# Patient Record
Sex: Female | Born: 1965 | Hispanic: No | Marital: Married | Smoking: Never smoker
Health system: Southern US, Community
[De-identification: ages and names within clinical notes are randomized; demographics above are authoritative.]

## PROBLEM LIST (undated history)

## (undated) DIAGNOSIS — J392 Other diseases of pharynx: Secondary | ICD-10-CM

## (undated) DIAGNOSIS — Z8759 Personal history of other complications of pregnancy, childbirth and the puerperium: Secondary | ICD-10-CM

## (undated) DIAGNOSIS — R51 Headache: Secondary | ICD-10-CM

## (undated) DIAGNOSIS — E041 Nontoxic single thyroid nodule: Secondary | ICD-10-CM

## (undated) DIAGNOSIS — E042 Nontoxic multinodular goiter: Secondary | ICD-10-CM

## (undated) DIAGNOSIS — R059 Cough, unspecified: Secondary | ICD-10-CM

## (undated) DIAGNOSIS — R519 Headache, unspecified: Secondary | ICD-10-CM

## (undated) DIAGNOSIS — R509 Fever, unspecified: Secondary | ICD-10-CM

## (undated) DIAGNOSIS — R05 Cough: Secondary | ICD-10-CM

## (undated) DIAGNOSIS — R140 Abdominal distension (gaseous): Secondary | ICD-10-CM

## (undated) HISTORY — PX: DILATION AND CURETTAGE OF UTERUS: SHX78

## (undated) HISTORY — DX: Nontoxic multinodular goiter: E04.2

## (undated) HISTORY — DX: Fever, unspecified: R50.9

---

## 2000-12-09 ENCOUNTER — Other Ambulatory Visit: Admission: RE | Admit: 2000-12-09 | Discharge: 2000-12-09 | Payer: Self-pay | Admitting: Obstetrics & Gynecology

## 2002-08-12 ENCOUNTER — Encounter: Admission: RE | Admit: 2002-08-12 | Discharge: 2002-08-12 | Payer: Self-pay | Admitting: Internal Medicine

## 2002-08-12 ENCOUNTER — Encounter: Payer: Self-pay | Admitting: Internal Medicine

## 2002-08-13 ENCOUNTER — Encounter: Admission: RE | Admit: 2002-08-13 | Discharge: 2002-08-13 | Payer: Self-pay | Admitting: Obstetrics and Gynecology

## 2002-08-13 ENCOUNTER — Encounter: Payer: Self-pay | Admitting: Obstetrics and Gynecology

## 2005-06-21 ENCOUNTER — Encounter: Admission: RE | Admit: 2005-06-21 | Discharge: 2005-06-21 | Payer: Self-pay | Admitting: Internal Medicine

## 2006-07-01 ENCOUNTER — Encounter: Admission: RE | Admit: 2006-07-01 | Discharge: 2006-07-01 | Payer: Self-pay | Admitting: Internal Medicine

## 2008-01-26 ENCOUNTER — Encounter: Admission: RE | Admit: 2008-01-26 | Discharge: 2008-01-26 | Payer: Self-pay | Admitting: Endocrinology

## 2008-02-23 ENCOUNTER — Encounter (INDEPENDENT_AMBULATORY_CARE_PROVIDER_SITE_OTHER): Payer: Self-pay | Admitting: Diagnostic Radiology

## 2008-02-23 ENCOUNTER — Other Ambulatory Visit: Admission: RE | Admit: 2008-02-23 | Discharge: 2008-02-23 | Payer: Self-pay | Admitting: Diagnostic Radiology

## 2008-02-23 ENCOUNTER — Encounter: Admission: RE | Admit: 2008-02-23 | Discharge: 2008-02-23 | Payer: Self-pay | Admitting: Endocrinology

## 2009-04-06 ENCOUNTER — Encounter: Admission: RE | Admit: 2009-04-06 | Discharge: 2009-04-06 | Payer: Self-pay | Admitting: Internal Medicine

## 2009-05-11 ENCOUNTER — Encounter: Admission: RE | Admit: 2009-05-11 | Discharge: 2009-05-11 | Payer: Self-pay | Admitting: Internal Medicine

## 2010-06-27 ENCOUNTER — Encounter: Admission: RE | Admit: 2010-06-27 | Discharge: 2010-06-27 | Payer: Self-pay | Admitting: Internal Medicine

## 2011-01-20 ENCOUNTER — Encounter: Payer: Self-pay | Admitting: Internal Medicine

## 2011-05-21 ENCOUNTER — Other Ambulatory Visit: Payer: Self-pay | Admitting: Internal Medicine

## 2011-05-21 DIAGNOSIS — Z1231 Encounter for screening mammogram for malignant neoplasm of breast: Secondary | ICD-10-CM

## 2011-06-19 ENCOUNTER — Ambulatory Visit
Admission: RE | Admit: 2011-06-19 | Discharge: 2011-06-19 | Disposition: A | Discharge status: Home / Self Care | Payer: BC Managed Care – PPO | Source: Ambulatory Visit | Attending: Internal Medicine | Admitting: Internal Medicine

## 2011-06-19 DIAGNOSIS — Z1231 Encounter for screening mammogram for malignant neoplasm of breast: Secondary | ICD-10-CM

## 2012-05-14 ENCOUNTER — Other Ambulatory Visit: Payer: Self-pay | Admitting: Internal Medicine

## 2012-05-14 DIAGNOSIS — Z1231 Encounter for screening mammogram for malignant neoplasm of breast: Secondary | ICD-10-CM

## 2012-06-23 ENCOUNTER — Ambulatory Visit: Payer: BC Managed Care – PPO

## 2012-06-24 ENCOUNTER — Ambulatory Visit
Admission: RE | Admit: 2012-06-24 | Discharge: 2012-06-24 | Disposition: A | Discharge status: Home / Self Care | Payer: BC Managed Care – PPO | Source: Ambulatory Visit | Attending: Internal Medicine | Admitting: Internal Medicine

## 2012-06-24 DIAGNOSIS — Z1231 Encounter for screening mammogram for malignant neoplasm of breast: Secondary | ICD-10-CM

## 2013-08-11 ENCOUNTER — Other Ambulatory Visit: Payer: Self-pay

## 2013-08-11 DIAGNOSIS — Z1231 Encounter for screening mammogram for malignant neoplasm of breast: Secondary | ICD-10-CM

## 2013-08-23 ENCOUNTER — Ambulatory Visit: Payer: BC Managed Care – PPO

## 2013-09-09 ENCOUNTER — Ambulatory Visit
Admission: RE | Admit: 2013-09-09 | Discharge: 2013-09-09 | Disposition: A | Discharge status: Home / Self Care | Payer: 59 | Source: Ambulatory Visit

## 2013-09-09 DIAGNOSIS — Z1231 Encounter for screening mammogram for malignant neoplasm of breast: Secondary | ICD-10-CM

## 2014-03-08 ENCOUNTER — Other Ambulatory Visit: Payer: Self-pay

## 2014-03-08 DIAGNOSIS — Z1231 Encounter for screening mammogram for malignant neoplasm of breast: Secondary | ICD-10-CM

## 2014-09-01 ENCOUNTER — Ambulatory Visit
Admission: RE | Admit: 2014-09-01 | Discharge: 2014-09-01 | Disposition: A | Discharge status: Home / Self Care | Payer: BC Managed Care – PPO | Source: Ambulatory Visit

## 2014-09-01 DIAGNOSIS — Z1231 Encounter for screening mammogram for malignant neoplasm of breast: Secondary | ICD-10-CM

## 2015-01-29 DIAGNOSIS — R6510 Systemic inflammatory response syndrome (SIRS) of non-infectious origin without acute organ dysfunction: Secondary | ICD-10-CM | POA: Diagnosis present

## 2015-01-29 DIAGNOSIS — R911 Solitary pulmonary nodule: Secondary | ICD-10-CM | POA: Diagnosis present

## 2015-01-29 DIAGNOSIS — D649 Anemia, unspecified: Secondary | ICD-10-CM | POA: Diagnosis present

## 2015-01-29 DIAGNOSIS — J210 Acute bronchiolitis due to respiratory syncytial virus: Principal | ICD-10-CM | POA: Diagnosis present

## 2015-01-29 DIAGNOSIS — E042 Nontoxic multinodular goiter: Secondary | ICD-10-CM | POA: Diagnosis present

## 2015-01-29 DIAGNOSIS — T753XXA Motion sickness, initial encounter: Secondary | ICD-10-CM | POA: Diagnosis present

## 2015-01-29 DIAGNOSIS — D259 Leiomyoma of uterus, unspecified: Secondary | ICD-10-CM | POA: Diagnosis present

## 2015-01-29 DIAGNOSIS — E876 Hypokalemia: Secondary | ICD-10-CM | POA: Diagnosis present

## 2015-01-29 DIAGNOSIS — R509 Fever, unspecified: Secondary | ICD-10-CM | POA: Diagnosis not present

## 2015-01-29 DIAGNOSIS — R740 Nonspecific elevation of levels of transaminase and lactic acid dehydrogenase [LDH]: Secondary | ICD-10-CM | POA: Diagnosis present

## 2015-01-30 ENCOUNTER — Inpatient Hospital Stay (HOSPITAL_COMMUNITY)
Admission: EM | Admit: 2015-01-30 | Discharge: 2015-02-02 | DRG: 202 | Disposition: A | Discharge status: Home / Self Care | Payer: BLUE CROSS/BLUE SHIELD | Attending: Internal Medicine | Admitting: Internal Medicine

## 2015-01-30 ENCOUNTER — Emergency Department (HOSPITAL_COMMUNITY): Payer: BLUE CROSS/BLUE SHIELD

## 2015-01-30 ENCOUNTER — Inpatient Hospital Stay (HOSPITAL_COMMUNITY): Payer: BLUE CROSS/BLUE SHIELD

## 2015-01-30 ENCOUNTER — Encounter (HOSPITAL_COMMUNITY): Payer: Self-pay | Admitting: *Deleted

## 2015-01-30 DIAGNOSIS — R05 Cough: Secondary | ICD-10-CM | POA: Insufficient documentation

## 2015-01-30 DIAGNOSIS — R740 Nonspecific elevation of levels of transaminase and lactic acid dehydrogenase [LDH]: Secondary | ICD-10-CM

## 2015-01-30 DIAGNOSIS — T753XXA Motion sickness, initial encounter: Secondary | ICD-10-CM | POA: Diagnosis present

## 2015-01-30 DIAGNOSIS — E042 Nontoxic multinodular goiter: Secondary | ICD-10-CM | POA: Diagnosis present

## 2015-01-30 DIAGNOSIS — R5081 Fever presenting with conditions classified elsewhere: Secondary | ICD-10-CM | POA: Diagnosis not present

## 2015-01-30 DIAGNOSIS — R7401 Elevation of levels of liver transaminase levels: Secondary | ICD-10-CM | POA: Insufficient documentation

## 2015-01-30 DIAGNOSIS — D259 Leiomyoma of uterus, unspecified: Secondary | ICD-10-CM | POA: Diagnosis present

## 2015-01-30 DIAGNOSIS — A419 Sepsis, unspecified organism: Secondary | ICD-10-CM

## 2015-01-30 DIAGNOSIS — J210 Acute bronchiolitis due to respiratory syncytial virus: Secondary | ICD-10-CM | POA: Insufficient documentation

## 2015-01-30 DIAGNOSIS — E041 Nontoxic single thyroid nodule: Secondary | ICD-10-CM

## 2015-01-30 DIAGNOSIS — B974 Respiratory syncytial virus as the cause of diseases classified elsewhere: Secondary | ICD-10-CM | POA: Diagnosis not present

## 2015-01-30 DIAGNOSIS — R6510 Systemic inflammatory response syndrome (SIRS) of non-infectious origin without acute organ dysfunction: Secondary | ICD-10-CM | POA: Diagnosis present

## 2015-01-30 DIAGNOSIS — I959 Hypotension, unspecified: Secondary | ICD-10-CM | POA: Insufficient documentation

## 2015-01-30 DIAGNOSIS — E876 Hypokalemia: Secondary | ICD-10-CM | POA: Diagnosis present

## 2015-01-30 DIAGNOSIS — R059 Cough, unspecified: Secondary | ICD-10-CM

## 2015-01-30 DIAGNOSIS — R509 Fever, unspecified: Secondary | ICD-10-CM | POA: Diagnosis present

## 2015-01-30 DIAGNOSIS — D649 Anemia, unspecified: Secondary | ICD-10-CM | POA: Diagnosis present

## 2015-01-30 DIAGNOSIS — R911 Solitary pulmonary nodule: Secondary | ICD-10-CM | POA: Diagnosis present

## 2015-01-30 LAB — CBC WITH DIFFERENTIAL/PLATELET
BASOS ABS: 0 10*3/uL (ref 0.0–0.1)
Basophils Relative: 0 % (ref 0–1)
Eosinophils Absolute: 0 10*3/uL (ref 0.0–0.7)
Eosinophils Relative: 0 % (ref 0–5)
HCT: 35.2 % — ABNORMAL LOW (ref 36.0–46.0)
HEMOGLOBIN: 11.6 g/dL — AB (ref 12.0–15.0)
Lymphocytes Relative: 9 % — ABNORMAL LOW (ref 12–46)
Lymphs Abs: 1.1 10*3/uL (ref 0.7–4.0)
MCH: 29.1 pg (ref 26.0–34.0)
MCHC: 33 g/dL (ref 30.0–36.0)
MCV: 88.2 fL (ref 78.0–100.0)
MONOS PCT: 11 % (ref 3–12)
Monocytes Absolute: 1.4 10*3/uL — ABNORMAL HIGH (ref 0.1–1.0)
NEUTROS PCT: 80 % — AB (ref 43–77)
Neutro Abs: 9.7 10*3/uL — ABNORMAL HIGH (ref 1.7–7.7)
PLATELETS: 195 10*3/uL (ref 150–400)
RBC: 3.99 MIL/uL (ref 3.87–5.11)
RDW: 12.7 % (ref 11.5–15.5)
WBC: 12.3 10*3/uL — AB (ref 4.0–10.5)

## 2015-01-30 LAB — COMPREHENSIVE METABOLIC PANEL
ALBUMIN: 3.4 g/dL — AB (ref 3.5–5.2)
ALK PHOS: 86 U/L (ref 39–117)
ALT: 41 U/L — AB (ref 0–35)
AST: 36 U/L (ref 0–37)
Anion gap: 6 (ref 5–15)
BILIRUBIN TOTAL: 0.8 mg/dL (ref 0.3–1.2)
BUN: 6 mg/dL (ref 6–23)
CO2: 26 mmol/L (ref 19–32)
CREATININE: 0.69 mg/dL (ref 0.50–1.10)
Calcium: 9.2 mg/dL (ref 8.4–10.5)
Chloride: 103 mmol/L (ref 96–112)
GFR calc non Af Amer: >90 mL/min (ref >90)
GLUCOSE: 225 mg/dL — AB (ref 70–99)
Potassium: 3.8 mmol/L (ref 3.5–5.1)
SODIUM: 135 mmol/L (ref 135–145)
TOTAL PROTEIN: 7.2 g/dL (ref 6.0–8.3)

## 2015-01-30 LAB — URINALYSIS, ROUTINE W REFLEX MICROSCOPIC
Bilirubin Urine: NEGATIVE
GLUCOSE, UA: NEGATIVE mg/dL
Ketones, ur: NEGATIVE mg/dL
Leukocytes, UA: NEGATIVE
NITRITE: NEGATIVE
PROTEIN: NEGATIVE mg/dL
Specific Gravity, Urine: 1.004 — ABNORMAL LOW (ref 1.005–1.030)
Urobilinogen, UA: 0.2 mg/dL (ref 0.0–1.0)
pH: 7 (ref 5.0–8.0)

## 2015-01-30 LAB — URINE MICROSCOPIC-ADD ON

## 2015-01-30 LAB — SEDIMENTATION RATE: Sed Rate: 50 mm/hr — ABNORMAL HIGH (ref 0–22)

## 2015-01-30 LAB — I-STAT CG4 LACTIC ACID, ED: Lactic Acid, Venous: 1.77 mmol/L (ref 0.5–2.0)

## 2015-01-30 LAB — TSH: TSH: 0.331 u[IU]/mL — AB (ref 0.350–4.500)

## 2015-01-30 LAB — INFLUENZA PANEL BY PCR (TYPE A & B)
H1N1FLUPCR: NOT DETECTED
Influenza A By PCR: NEGATIVE
Influenza B By PCR: NEGATIVE

## 2015-01-30 LAB — T4, FREE: FREE T4: 1.38 ng/dL (ref 0.80–1.80)

## 2015-01-30 LAB — C-REACTIVE PROTEIN: CRP: 7.1 mg/dL — AB (ref <0.60)

## 2015-01-30 MED ORDER — ACETAMINOPHEN 325 MG PO TABS
650.0000 mg | ORAL_TABLET | Freq: Four times a day (QID) | ORAL | Status: DC | PRN
  Administered 2015-01-30: 650 mg via ORAL
  Filled 2015-01-30: qty 2

## 2015-01-30 MED ORDER — ENOXAPARIN SODIUM 40 MG/0.4ML ~~LOC~~ SOLN
40.0000 mg | SUBCUTANEOUS | Status: DC
  Administered 2015-01-30: 40 mg via SUBCUTANEOUS
  Filled 2015-01-30: qty 0.4

## 2015-01-30 MED ORDER — ACETAMINOPHEN 650 MG RE SUPP: 650.0000 mg | Freq: Four times a day (QID) | RECTAL | Status: DC | PRN

## 2015-01-30 MED ORDER — ACETAMINOPHEN 325 MG PO TABS
650.0000 mg | ORAL_TABLET | Freq: Four times a day (QID) | ORAL | Status: DC | PRN
  Administered 2015-02-01: 650 mg via ORAL
  Filled 2015-01-30: qty 2

## 2015-01-30 MED ORDER — SODIUM CHLORIDE 0.9 % IV BOLUS (SEPSIS)
1000.0000 mL | Freq: Once | INTRAVENOUS | Status: AC
  Administered 2015-01-30: 1000 mL via INTRAVENOUS

## 2015-01-30 MED ORDER — SODIUM CHLORIDE 0.9 % IV SOLN
INTRAVENOUS | Status: DC
  Administered 2015-01-30: 1000 mL via INTRAVENOUS

## 2015-01-30 MED ORDER — SODIUM CHLORIDE 0.9 % IJ SOLN
3.0000 mL | Freq: Two times a day (BID) | INTRAMUSCULAR | Status: DC
  Administered 2015-01-30 – 2015-02-01 (×5): 3 mL via INTRAVENOUS

## 2015-01-30 MED ORDER — ONDANSETRON HCL 4 MG PO TABS: 4.0000 mg | ORAL_TABLET | Freq: Four times a day (QID) | ORAL | Status: DC | PRN

## 2015-01-30 MED ORDER — ONDANSETRON HCL 4 MG/2ML IJ SOLN: 4.0000 mg | Freq: Four times a day (QID) | INTRAMUSCULAR | Status: DC | PRN

## 2015-01-30 MED ORDER — SODIUM CHLORIDE 0.9 % IV SOLN
INTRAVENOUS | Status: DC
Start: 2015-01-30 — End: 2015-02-02
  Administered 2015-01-30: 125 mL/h via INTRAVENOUS
  Administered 2015-01-30: 08:00:00 via INTRAVENOUS
  Administered 2015-01-31: 125 mL/h via INTRAVENOUS
  Administered 2015-02-01 (×2): via INTRAVENOUS

## 2015-01-30 MED ORDER — IOHEXOL 300 MG/ML  SOLN
100.0000 mL | Freq: Once | INTRAMUSCULAR | Status: AC | PRN
  Administered 2015-01-30: 100 mL via INTRAVENOUS

## 2015-01-30 NOTE — ED Notes (Signed)
Report given to [facility],

## 2015-01-30 NOTE — Progress Notes (Addendum)
01/30/2015 3:07 PM  Phone call received from lab--preliminary reporting negative for malaria parasite. Dr. Curly Rim informed.  Princella Pellegrini

## 2015-01-30 NOTE — ED Notes (Signed)
Patient transported to Ultrasound 

## 2015-01-30 NOTE — ED Notes (Signed)
Pt in with family reporting sickness for the last few weeks, cough with fever, symptoms worsened after a recent trip to [place], family reports chills also, pt alert

## 2015-01-30 NOTE — H&P (Signed)
[facility] History and Physical  Patient: Tanya Armstrong  MRN: [id number]  DOB: 02-Aug-1966  DOS: the patient was seen and examined on 01/30/2015 PCP: No primary care provider on file.  Chief Complaint: Fever with chills and cough  HPI: Tanya Armstrong is a 49 y.o. female with no significant Past medical history. Patient does not speak [place] fluently and family has hard to interpret information. The patient presents with complaints of fever or chills and cough. Patient recently has gone to [place], where she has felt feverish and ill for 3-4 days. She has also seen a physician that but denies being treated with any antibiotics. In [place] she was sleeping outside in open and has been exposed to mosquito bites. Patient denies any diarrhea, constipation, change in the color of the stool, change in the color of the urine, blood in the urine. Patient denies any nausea or vomiting acid reflux abdominal pain chest pain shortness of breath. Patient has been having on and off episodes of fever with SIRS with typical cycles of chills followed by diaphoresis followed by feeling better followed by having chills and fever again. Patient denies any prior exposure to malaria. Patient denies any rash anywhere. Patient denies any abdominal pelvic pain.  The patient is coming from home. And at her baseline independent for most of her ADL.  Review of Systems: as mentioned in the history of present illness.  A Comprehensive review of the other systems is negative.  History reviewed. No pertinent past medical history. History reviewed. No pertinent past surgical history. Social History:  reports that she has never smoked. She does not have any smokeless tobacco history on file. Her alcohol and drug histories are not on file.  No Known Allergies  History reviewed. No pertinent family history.  Prior to Admission medications   Medication Sig Start Date End Date Taking? Authorizing Provider   azithromycin (ZITHROMAX) 250 MG tablet Take 250 mg by mouth daily. Take for 4 days starting 01/30/15 01/30/15 02/02/15 Yes Historical Provider, MD  ibuprofen (ADVIL,MOTRIN) 200 MG tablet Take 400 mg by mouth every 8 (eight) hours as needed for fever.   Yes Historical Provider, MD    Physical Exam: Filed Vitals:   01/30/15 0430 01/30/15 0500 01/30/15 0530 01/30/15 0540  BP: $Re'90/53 99/60 95/62 'MGd$   Pulse: 85 85 85   Temp:    98.8 F (37.1 C)  TempSrc:    Oral  Resp: $Remo'19 19 18   'MRISd$ SpO2: 98% 98% 98%     General: Alert, Awake and Oriented to Time, Place and Person. Appear in mild distress Eyes: PERRL ENT: Oral Mucosa clear moist. Neck: no JVD Cardiovascular: S1 and S2 Present, no Murmur, Peripheral Pulses Present Respiratory: Bilateral Air entry equal and Decreased, Clear to Auscultation, noCrackles, no wheezes Abdomen: Bowel Sound present, Soft and non tender Skin: no Rash Extremities: no Pedal edema, no calf tenderness Neurologic: Grossly no focal neuro deficit. No signs of meningismus  Labs on Admission:  CBC:  Recent Labs Lab 01/30/15 0011  WBC 12.3*  NEUTROABS 9.7*  HGB 11.6*  HCT 35.2*  MCV 88.2  PLT 195    CMP     Component Value Date/Time   NA 135 01/30/2015 0011   K 3.8 01/30/2015 0011   CL 103 01/30/2015 0011   CO2 26 01/30/2015 0011   GLUCOSE 225* 01/30/2015 0011   BUN 6 01/30/2015 0011   CREATININE 0.69 01/30/2015 0011   CALCIUM 9.2 01/30/2015 0011   PROT 7.2 01/30/2015  0011   ALBUMIN 3.4* 01/30/2015 0011   AST 36 01/30/2015 0011   ALT 41* 01/30/2015 0011   ALKPHOS 86 01/30/2015 0011   BILITOT 0.8 01/30/2015 0011   GFRNONAA >90 01/30/2015 0011   GFRAA >90 01/30/2015 0011    No results for input(s): LIPASE, AMYLASE in the last 168 hours.  No results for input(s): CKTOTAL, CKMB, CKMBINDEX, TROPONINI in the last 168 hours. BNP (last 3 results) No results for input(s): PROBNP in the last 8760 hours.  Radiological Exams on Admission: Dg Chest Port 1  View  01/30/2015   CLINICAL DATA:  Fevers, and chills, tremor for 2-3 days, diaphoresis. [place] The on antibiotics.  EXAM: PORTABLE CHEST - 1 VIEW  COMPARISON:  None.  FINDINGS: Cardiomediastinal silhouette is unremarkable. Strandy densities in the lung bases. No pleural effusion. No pneumothorax. Trachea is displaced to the LEFT at the thoracic inlet. Soft tissue planes and included osseous structures are nonacute  IMPRESSION: RIGHT lung base probable atelectasis.  Trachea displaced to the LEFT, infers thyroid mass, status post thyroid biopsy February 23, 2008.   Electronically Signed   By: Elon Alas   On: 01/30/2015 01:39    Assessment/Plan Principal Problem:   SIRS (systemic inflammatory response syndrome) Active Problems:   Fever   Thyroid cyst   1. SIRS (systemic inflammatory response syndrome) The patient is presenting with complaints of fever or chills and dry cough. Workup so far shows anemia, leukocytosis, elevated glucose, hypotension tachycardia and fever. The patient has not responded to 3 L of fluid and is currently on April 4 fluid with improving blood pressure. Patient appears toxic with diaphoresis and cold and clammy hands. At present there is no evidence of infection with normal urine and normal chest x-ray. I have sent out a malaria smear ESR CRP influenza PCR sputum culture. Currently since she is improving with hemodynamic stability therefore I will be holding off on antibiotic until malaria smear is back.  2. Thyroid cyst Tracheal shift. The patient also appears to have shift of trachea from midline. 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 At present her TSH is on a lower side. A recheck free T4 and T3 thyroid antibodies and ultrasound of the thyroid. Patient does not appear to have any airway compromise at present.  Advance goals of care discussion:  full code  DVT Prophylaxis:  subcutaneous Heparin Nutrition: Regular diet   Family Communicafamily  was present at bedside, opportunity was given to ask question and all questions were answered satisfactorily at the time of interview. Disposition: Admitted to inpatient in telemetry unit.  Author: Berle Mull, MD [facility] Pager: [phone] 01/30/2015, 6:21 AM    If 7PM-7AM, please contact night-coverage [email or web address] Password [email or web address]

## 2015-01-30 NOTE — ED Notes (Signed)
Dr Posey Pronto assessed pt and bed to be changed to telemetry

## 2015-01-30 NOTE — Consult Note (Signed)
[facility]    Date of Admission:  01/30/2015  Date of Consult:  01/30/2015  Reason for Consult: fever of unknown origin Referring Physician: Dr. [place] Pink   HPI: Tanya Armstrong is an 49 y.o. female with past mental history significant for thyroid nodule that was biopsied in the past had been visiting [place] before Christmas and recently returned around January 21. In [place] she been staying in her oral area near multiple [employer]. She been visiting because her father just passed away and she was attending his funeral. She states that she did sleep outside quite a bit and did not detect herself against mosquitoes and was bitten on numerous occasions by mosquitoes 1 [place]. She is feeling relatively well until she had come back from [place] and developed fevers right years and chills. These had lasted for a few days and then dissipated. He then returned and she was seen in the emergency department on the 22nd. She had a cough as well as part of this which was nonproductive. She was seen on 01/30/15 and given a protrusion for azithromycin and received one dose, bu then returned later that day with persistent fevers and chills and admitted to the hospitalist service. A smear was taken for malaria which has been negative on thin smear. Fixed varus pending. Chest x-ray does not show any clear-cut evidence of pneumonia she had an ultrasound of her neck to reassess her thyroid nodules are new new findings here either. Her admission labs are pertinent for leukocytosis, and slightly elevated AST. Her sedimentation rate is also elevated at 50.  She does not recall any history of latent tuberculosis or exposure to tuberculosis. She did walk barefoot some in her relative's house and in the area around she did not bathe or entering into any eyes of water to lake  or stream. No other people have been L around her including her children who live here in  [place]   History  reviewed. No pertinent past medical history.  History reviewed. No pertinent past surgical history.ergies:   No Known Allergies   Medications: I have reviewed patients current medications as documented in Epic Anti-infectives    None      Social History:  reports that she has never smoked. She does not have any smokeless tobacco history on file. Her alcohol and drug histories are not on file.  History reviewed. No pertinent family history.  As in HPI and primary teams notes otherwise 12 point review of systems is negative  Blood pressure 109/72, pulse 89, temperature 98.7 F (37.1 C), temperature source Oral, resp. rate 14, last menstrual period 01/23/2015, SpO2 100 %. General: Alert and awake, oriented x3, not in any acute distress. HEENT: anicteric sclera EOMI, oropharynx clear and without exudate CVS regular rate, normal r,  no murmur rubs or gallops Chest: clear to auscultation bilaterally, no wheezing, rales or rhonchi Abdomen: soft nontender, nondistended, normal bowel sounds, Extremities: no  clubbing or edema noted bilaterally Skin: no rashes. Right arm was slight hypopigmentation but this is chronic Neuro: nonfocal, strength and sensation intact   Results for orders placed or performed during the hospital encounter of 01/30/15 (from the past 48 hour(s))  CBC WITH DIFFERENTIAL     Status: Abnormal   Collection Time: 01/30/15 12:11 AM  Result Value Ref Range   WBC 12.3 (H) 4.0 - 10.5 K/uL   RBC 3.99 3.87 - 5.11 MIL/uL   Hemoglobin 11.6 (L) 12.0 - 15.0 g/dL  HCT 35.2 (L) 36.0 - 46.0 %   MCV 88.2 78.0 - 100.0 fL   MCH 29.1 26.0 - 34.0 pg   MCHC 33.0 30.0 - 36.0 g/dL   RDW 57.9 34.1 - 61.0 %   Platelets 195 150 - 400 K/uL   Neutrophils Relative % 80 (H) 43 - 77 %   Neutro Abs 9.7 (H) 1.7 - 7.7 K/uL   Lymphocytes Relative 9 (L) 12 - 46 %   Lymphs Abs 1.1 0.7 - 4.0 K/uL   Monocytes Relative 11 3 - 12 %   Monocytes Absolute 1.4 (H) 0.1 - 1.0 K/uL   Eosinophils  Relative 0 0 - 5 %   Eosinophils Absolute 0.0 0.0 - 0.7 K/uL   Basophils Relative 0 0 - 1 %   Basophils Absolute 0.0 0.0 - 0.1 K/uL  Comprehensive metabolic panel     Status: Abnormal   Collection Time: 01/30/15 12:11 AM  Result Value Ref Range   Sodium 135 135 - 145 mmol/L   Potassium 3.8 3.5 - 5.1 mmol/L   Chloride 103 96 - 112 mmol/L   CO2 26 19 - 32 mmol/L   Glucose, Bld 225 (H) 70 - 99 mg/dL   BUN 6 6 - 23 mg/dL   Creatinine, Ser 6.61 0.50 - 1.10 mg/dL   Calcium 9.2 8.4 - 68.1 mg/dL   Total Protein 7.2 6.0 - 8.3 g/dL   Albumin 3.4 (L) 3.5 - 5.2 g/dL   AST 36 0 - 37 U/L   ALT 41 (H) 0 - 35 U/L   Alkaline Phosphatase 86 39 - 117 U/L   Total Bilirubin 0.8 0.3 - 1.2 mg/dL   GFR calc non Af Amer >90 >90 mL/min   GFR calc Af Amer >90 >90 mL/min    Comment: (NOTE) The eGFR has been calculated using the CKD EPI equation. This calculation has not been validated in all clinical situations. eGFR's persistently <90 mL/min signify possible Chronic Kidney Disease.    Anion gap 6 5 - 15  T4, free     Status: None   Collection Time: 01/30/15 12:14 AM  Result Value Ref Range   Free T4 1.38 0.80 - 1.80 ng/dL    Comment: Performed at [facility]  I-Stat CG4 Lactic Acid, ED     Status: None   Collection Time: 01/30/15 12:31 AM  Result Value Ref Range   Lactic Acid, Venous 1.77 0.5 - 2.0 mmol/L  Urinalysis with microscopic     Status: Abnormal   Collection Time: 01/30/15  1:10 AM  Result Value Ref Range   Color, Urine YELLOW YELLOW   APPearance CLEAR CLEAR   Specific Gravity, Urine 1.004 (L) 1.005 - 1.030   pH 7.0 5.0 - 8.0   Glucose, UA NEGATIVE NEGATIVE mg/dL   Hgb urine dipstick SMALL (A) NEGATIVE   Bilirubin Urine NEGATIVE NEGATIVE   Ketones, ur NEGATIVE NEGATIVE mg/dL   Protein, ur NEGATIVE NEGATIVE mg/dL   Urobilinogen, UA 0.2 0.0 - 1.0 mg/dL   Nitrite NEGATIVE NEGATIVE   Leukocytes, UA NEGATIVE NEGATIVE  Urine microscopic-add on     Status: None   Collection  Time: 01/30/15  1:10 AM  Result Value Ref Range   Squamous Epithelial / LPF RARE RARE   WBC, UA 0-2 <3 WBC/hpf   RBC / HPF 3-6 <3 RBC/hpf   Bacteria, UA RARE RARE  TSH     Status: Abnormal   Collection Time: 01/30/15  3:00 AM  Result Value Ref Range  TSH 0.331 (L) 0.350 - 4.500 uIU/mL  Sedimentation rate     Status: Abnormal   Collection Time: 01/30/15  5:18 AM  Result Value Ref Range   Sed Rate 50 (H) 0 - 22 mm/hr  C-reactive protein     Status: Abnormal   Collection Time: 01/30/15  5:18 AM  Result Value Ref Range   CRP 7.1 (H) <0.60 mg/dL    Comment: Performed at [facility] smear     Status: None (Preliminary result)   Collection Time: 01/30/15  6:36 AM  Result Value Ref Range   Specimen Description Blood    Special Requests Normal    Malaria Prep      Negative pending thick smear review Performed at [facility]    Report Status PENDING   Influenza panel by PCR (type A & B, H1N1)     Status: None   Collection Time: 01/30/15  6:37 AM  Result Value Ref Range   Influenza A By PCR NEGATIVE NEGATIVE   Influenza B By PCR NEGATIVE NEGATIVE   H1N1 flu by pcr NOT DETECTED NOT DETECTED    Comment:        The Xpert Flu assay (FDA approved for nasal aspirates or washes and nasopharyngeal swab specimens), is intended as an aid in the diagnosis of influenza and should not be used as a sole basis for treatment.    '@BRIEFLABTABLE'$ (sdes,specrequest,cult,reptstatus)   ) Recent Results (from the past 720 hour(s))  Malaria smear     Status: None (Preliminary result)   Collection Time: 01/30/15  6:36 AM  Result Value Ref Range Status   Specimen Description Blood  Final   Special Requests Normal  Final   Malaria Prep   Final    Negative pending thick smear review Performed at [facility] PENDING  Incomplete     Impression/Recommendation  Principal Problem:   SIRS (systemic inflammatory response syndrome) Active  Problems:   Fever   Thyroid cyst   Rayanna Matusik is a 49 y.o. female with FUO after returning from [place].  #1 FUO in returning traveller: Still have concerns for Malaria. Dengue possible though does not seem likely. Not good fit for Chikungunya either I doubt [place] Encephalitis. Acute hepatitis, HIV, CMV, EBV need to be considered as does Typhoid  --resend malaria smear --check hepatitis panel and HIV test, EBV and CMV serologies --if having loose bowel movements send stool for culture and O and P --will check respiratory virus panel PCR --would resend blood cultures if still febrile --will check CT chest abdomen and pelvis given how long fevers have lasted, though the cyclic nature would make one think strongly about malaria none seen on initial smear here         01/30/2015, 2:13 PM   Thank you so much for this interesting consult  [facility] [phone] (pager) [phone] (office) 01/30/2015, 2:13 PM  Rhina [place] 01/30/2015, 2:13 PM

## 2015-01-30 NOTE — ED Notes (Signed)
Attempted report 

## 2015-01-30 NOTE — Progress Notes (Signed)
Attempted Report 

## 2015-01-30 NOTE — ED Notes (Signed)
Spoke with MD Lita Mains regarding patient meeting code sepsis criteria, stated not to initiate at this time, will re-evaluate after tylenol and fluids, also will consider lactic acid result.

## 2015-01-30 NOTE — ED Provider Notes (Signed)
CSN: [id number]     Arrival date & time 01/29/15  2339 History   First MD Initiated Contact with Patient 01/30/15 0005     This chart was scribed for Tanya Dumas, MD by Tanya Armstrong, ED Scribe. This patient was seen in room 6E24C/6E24C-01 and the patient's care was started 12:07 AM.   Chief Complaint  Patient presents with  . Fever   HPI  HPI Comments: Tanya Armstrong is a 49 y.o. female without any significant past medical history who presents to the Emergency Department fever and chills. Husband reports fever, "shaking all over" spells, and diaphoresis onset 2-3 days. He states diaphoresis typically only comes on after shaking spells. Pt recently came back from [place] on 01/19/2014. She recently seen at Urgent Care and was prescribed Azithromycin, however, unsure of her diagnosis. No recent nasal congestion, rash, vomiting, diarrhea, neck/ back pain, abdominal pain, chest pain, urinary frequency or dysuria. No known allergies to medications.  History reviewed. No pertinent past medical history. History reviewed. No pertinent past surgical history. History reviewed. No pertinent family history. History  Substance Use Topics  . Smoking status: Never Smoker   . Smokeless tobacco: Never Used  . Alcohol Use: No   OB History    No data available     Review of Systems  Constitutional: Positive for fever, chills and diaphoresis.  HENT: Negative for congestion, sinus pressure and sore throat.   Respiratory: Negative for cough and shortness of breath.   Cardiovascular: Negative for chest pain and palpitations.  Gastrointestinal: Negative for nausea, vomiting, abdominal pain and diarrhea.  Genitourinary: Negative for frequency, hematuria and flank pain.  Musculoskeletal: Negative for back pain, neck pain and neck stiffness.  Skin: Negative for rash and wound.  Neurological: Negative for dizziness, weakness, numbness and headaches.  All other systems reviewed and are  negative.     Allergies  Review of patient's allergies indicates no known allergies.  Home Medications   Prior to Admission medications   Medication Sig Start Date End Date Taking? Authorizing Provider  azithromycin (ZITHROMAX) 250 MG tablet Take 250 mg by mouth daily. Take for 4 days starting 01/30/15 01/30/15 02/02/15 Yes Historical Provider, MD  ibuprofen (ADVIL,MOTRIN) 200 MG tablet Take 400 mg by mouth every 8 (eight) hours as needed for fever.   Yes Historical Provider, MD   Triage Vitals: BP 99/64 mmHg  Pulse 71  Temp(Src) 98.7 F (37.1 C) (Oral)  Resp 14  Wt 112 lb 2.4 oz (50.87 kg)  SpO2 99%  LMP 01/23/2015   Physical Exam  Constitutional: She is oriented to person, place, and time. She appears well-developed and well-nourished. No distress.  HENT:  Head: Normocephalic and atraumatic.  Mouth/Throat: Oropharynx is clear and moist. No oropharyngeal exudate.  No sinus tenderness  Eyes: EOM are normal. Pupils are equal, round, and reactive to light.  Neck: Normal range of motion. Neck supple. Thyromegaly present.  No nuchal rigidity or other evidence of meningismus. Soft nontender right-sided thyroid mass  Cardiovascular: Regular rhythm.   Tachycardia  Pulmonary/Chest: Effort normal and breath sounds normal. No respiratory distress. She has no wheezes. She has no rales. She exhibits no tenderness.  Abdominal: Soft. Bowel sounds are normal. She exhibits no distension and no mass. There is no tenderness. There is no rebound and no guarding.  Musculoskeletal: Normal range of motion. She exhibits no edema or tenderness.  No lower extremity swelling or tenderness.  Neurological: She is alert and oriented to person, place, and time.  Moves all extremities without deficit. Sensation is intact.  Skin: Skin is warm and dry. No rash noted. No erythema.  Psychiatric: She has a normal mood and affect. Her behavior is normal.  Nursing note and vitals reviewed.   ED Course  Procedures  (including critical care time)  DIAGNOSTIC STUDIES: Oxygen Saturation is 97% on RA, adequate by my interpretation.    COORDINATION OF CARE: 4:50 AM-Discussed treatment plan with pt at bedside and pt agreed to plan.     Labs Review Labs Reviewed  RESPIRATORY VIRUS PANEL - Abnormal; Notable for the following:    Respiratory Syncytial Virus A Positive (*)    All other components within normal limits  CBC WITH DIFFERENTIAL/PLATELET - Abnormal; Notable for the following:    WBC 12.3 (*)    Hemoglobin 11.6 (*)    HCT 35.2 (*)    Neutrophils Relative % 80 (*)    Neutro Abs 9.7 (*)    Lymphocytes Relative 9 (*)    Monocytes Absolute 1.4 (*)    All other components within normal limits  COMPREHENSIVE METABOLIC PANEL - Abnormal; Notable for the following:    Glucose, Bld 225 (*)    Albumin 3.4 (*)    ALT 41 (*)    All other components within normal limits  URINALYSIS, ROUTINE W REFLEX MICROSCOPIC - Abnormal; Notable for the following:    Specific Gravity, Urine 1.004 (*)    Hgb urine dipstick SMALL (*)    All other components within normal limits  TSH - Abnormal; Notable for the following:    TSH 0.331 (*)    All other components within normal limits  SEDIMENTATION RATE - Abnormal; Notable for the following:    Sed Rate 50 (*)    All other components within normal limits  C-REACTIVE PROTEIN - Abnormal; Notable for the following:    CRP 7.1 (*)    All other components within normal limits  COMPREHENSIVE METABOLIC PANEL - Abnormal; Notable for the following:    Potassium 3.4 (*)    BUN <5 (*)    Calcium 8.1 (*)    Total Protein 5.7 (*)    Albumin 2.9 (*)    ALT 38 (*)    All other components within normal limits  CBC WITH DIFFERENTIAL/PLATELET - Abnormal; Notable for the following:    RBC 3.61 (*)    Hemoglobin 10.5 (*)    HCT 32.1 (*)    Monocytes Relative 14 (*)    Monocytes Absolute 1.1 (*)    All other components within normal limits  EPSTEIN-BARR VIRUS VCA ANTIBODY  PANEL - Abnormal; Notable for the following:    EBV VCA IgG >750.0 (*)    EBV NA IgG 535.0 (*)    EBV EA IgG 24.9 (*)    All other components within normal limits  CBC - Abnormal; Notable for the following:    RBC 3.77 (*)    Hemoglobin 10.9 (*)    HCT 33.2 (*)    All other components within normal limits  BASIC METABOLIC PANEL - Abnormal; Notable for the following:    BUN <5 (*)    All other components within normal limits  CULTURE, BLOOD (ROUTINE X 2)  CULTURE, BLOOD (ROUTINE X 2)  URINE CULTURE  MALARIA SMEAR  MALARIA SMEAR  CULTURE, EXPECTORATED SPUTUM-ASSESSMENT  T4, FREE  URINE MICROSCOPIC-ADD ON  THYROGLOBULIN ANTIBODY  THYROID PEROXIDASE ANTIBODY  INFLUENZA PANEL BY PCR (TYPE A & B, H1N1)  HIV ANTIBODY (ROUTINE TESTING)  HEPATITIS  PANEL, ACUTE  QUANTIFERON TB GOLD ASSAY (BLOOD)  CMV ANTIBODY, IGG (EIA)  CMV IGM  QUANTIFERON IN TUBE  CRYOGLOBULIN  RPR  MPO/PR-3 (ANCA) ANTIBODIES  PROTEIN ELECTROPHORESIS, SERUM  CBC WITH DIFFERENTIAL/PLATELET  ANA  RHEUMATOID FACTOR  CK  LACTATE DEHYDROGENASE  ANGIOTENSIN CONVERTING ENZYME  FERRITIN  I-STAT CG4 LACTIC ACID, ED    Imaging Review Dg Chest 2 View  02/01/2015   CLINICAL DATA:  Fever.  Initial encounter.  EXAM: CHEST  2 VIEW  COMPARISON:  Radiographs and CT 01/30/2015.  FINDINGS: The heart size and mediastinal contours are stable with stable mild prominence of the right hilum. The lungs are clear. There is no pleural effusion or pneumothorax. The bones appear unchanged.  IMPRESSION: Stable chest. No evidence of pneumonia or other active cardiopulmonary process.   Electronically Signed   By: Camie Patience M.D.   On: 02/01/2015 13:36     EKG Interpretation None      MDM   Final diagnoses:  Cough  Fever, unspecified fever cause  Hypotension, unspecified hypotension type   I personally performed the services described in this documentation, which was scribed in my presence. The recorded information has been  reviewed and is accurate.  No source for fever found.. Patient's tachycardia improved in the emergency department. Blood pressure has remained borderline. Discussed with tried hospitalist and will admit.  Julianne Rice, MD 02/02/15 [phone]

## 2015-01-30 NOTE — Progress Notes (Signed)
[facility] PROGRESS NOTE  Tanya Armstrong TJQ:[id number] DOB: Jul 31, 1966 DOA: 01/30/2015  PCP: Does not have a PCP  Brief HPI: 49 year old female from [place], presented with fever, chills and cough. Patient was in [place] up until Jan 21st for vacation. Her symptoms started when she was there and has persisted even after returning, although it is unclear if she did have high fevers. He initially went to the urgent care center and was prescribed azithromycin. She then presented to the emergency department with continued fever, chills and cough and was admitted for further workup and management.  Consultants: ID  Procedures: None  Antibiotics: None  Subjective: Spoke to the patient with interpretation provided by husband. Patient continues to have a cough with whitish expectoration. Denies any shortness of breath. No chest pain. No nausea, vomiting or diarrhea. Chills, mainly in the nighttime.  Objective: Vital Signs  Filed Vitals:   01/30/15 0600 01/30/15 0615 01/30/15 0630 01/30/15 0818  BP: 107/68 102/57 99/63 109/72  Pulse: 93 78 78 89  Temp:    98.7 F (37.1 C)  TempSrc:    Oral  Resp: 12 12 11 14   SpO2: 98% 98% 98% 100%    Intake/Output Summary (Last 24 hours) at 01/30/15 1108 Last data filed at 01/30/15 0531  Gross per 24 hour  Intake   1000 ml  Output      0 ml  Net   1000 ml   There were no vitals filed for this visit.   General appearance: alert, cooperative, appears stated age and no distress Head: Normocephalic, without obvious abnormality, atraumatic Resp: Decreased breath sounds at the bases but no definite crackles or wheezing. Cardio: regular rate and rhythm, S1, S2 normal, no murmur, click, rub or gallop GI: soft, non-tender; bowel sounds normal; no masses,  no organomegaly Skin: Skin color, texture, turgor normal. No rashes or lesions Neurologic: Alert and oriented X 3, normal strength and tone. Normal symmetric reflexes. Normal coordination  and gait  Lab Results:  Basic Metabolic Panel:  Recent Labs Lab 01/30/15 0011  NA 135  K 3.8  CL 103  CO2 26  GLUCOSE 225*  BUN 6  CREATININE 0.69  CALCIUM 9.2   Liver Function Tests:  Recent Labs Lab 01/30/15 0011  AST 36  ALT 41*  ALKPHOS 86  BILITOT 0.8  PROT 7.2  ALBUMIN 3.4*   CBC:  Recent Labs Lab 01/30/15 0011  WBC 12.3*  NEUTROABS 9.7*  HGB 11.6*  HCT 35.2*  MCV 88.2  PLT 195     Studies/Results: US Soft Tissue Head/neck  01/30/2015   CLINICAL DATA:  Thyroid cyst  EXAM: THYROID ULTRASOUND  TECHNIQUE: Ultrasound examination of the thyroid gland and adjacent soft tissues was performed.  COMPARISON:  01/26/2008  FINDINGS: Right thyroid lobe  Measurements: 6.5 x 3.2 x 3.6 cm. There are complex, primarily cystic nodules throughout the right lobe.  In the upper pole, the largest multi cystic nodule measures 2.7 x 1.3 x 1.7 cm. In the right lobe this is the nodule with the most solid component and is in the upper pole, and appears similar to 2009 when accounting for measurement error.  In the right lower pole there is a cluster of 3 cystic nodules with septations and solid areas, 1.4, 2.1, and 3.1 cm in maximal diameter. Some of the cysts contain echogenic foci with ring down artifact consistent with colloid.  Left thyroid lobe  Measurements: 5.5 x 1.3 x 1.6 cm. There are 2 measured complex  cystic lesions, 2.1 x 1 x 1 cm in the upper pole and 8 x 5 x 7 mm in the lower pole. The upper pole lesion appears mildly larger compared to 2009, now 2.1 cm, previously 1.3 cm. This solid and cystic nodule appears to have been biopsied in 2009.  Isthmus  Thickness: 5 mm. There is a cystic 18 mm lesion in the right isthmus with colloid type internal echoes.  Lymphadenopathy  None visualized.  IMPRESSION: Numerous bilateral thyroid nodules, primarily cystic. Nodule size has varied since 2009, but there is no dominant growing nodule.   Electronically Signed   By: Jorje Guild M.D.    On: 01/30/2015 07:49   Dg Chest Port 1 View  01/30/2015   CLINICAL DATA:  Fevers, and chills, tremor for 2-3 days, diaphoresis. Recent trip to the [place] knob. The on antibiotics.  EXAM: PORTABLE CHEST - 1 VIEW  COMPARISON:  None.  FINDINGS: Cardiomediastinal silhouette is unremarkable. Strandy densities in the lung bases. No pleural effusion. No pneumothorax. Trachea is displaced to the LEFT at the thoracic inlet. Soft tissue planes and included osseous structures are nonacute  IMPRESSION: RIGHT lung base probable atelectasis.  Trachea displaced to the LEFT, infers thyroid mass, status post thyroid biopsy February 23, 2008.   Electronically Signed   By: Elon Alas   On: 01/30/2015 01:39    Medications:  Scheduled: . enoxaparin (LOVENOX) injection  40 mg Subcutaneous Q24H  . sodium chloride  3 mL Intravenous Q12H   Continuous: . sodium chloride 125 mL/hr at 01/30/15 0825   PZW:[id number] **OR** acetaminophen, ondansetron **OR** ondansetron (ZOFRAN) IV  Assessment/Plan:  Principal Problem:   SIRS (systemic inflammatory response syndrome) Active Problems:   Fever   Thyroid cyst    Fever with SIRS (systemic inflammatory response syndrome) Lactic acid is normal. Appears to be respiratory tract infection. Chest x-ray did not show any clear-cut infiltrate or other acute process. Recent travel to [place] confounds diagnosis. Smear has been sent for malarial parasites. Continue droplet precaution. She is being observed off of antibiotics currently. Influenza PCR was negative. We will go ahead and consult infectious diseases to help with management. Currently, she remains hemodynamically stable.  Multiple Thyroid nodules with tracheal deviation Noted on ultrasound. Not much change compared to previous. No respiratory compromise. Her TSH was low but FT4 is normal. This can be further pursued as an outpatient. She did undergo a biopsy in 2009 which showed cystic material.  DVT  Prophylaxis: Lovenox    Code Status: Full code  Family Communication: Discussed with the patient and her husband  Disposition Plan: Not ready for discharge    LOS: 0 days   [facility] Pager [phone] 01/30/2015, 11:08 AM  If 7PM-7AM, please contact night-coverage at [email or web address], password [facility]

## 2015-01-31 ENCOUNTER — Encounter (HOSPITAL_COMMUNITY): Payer: Self-pay | Admitting: *Deleted

## 2015-01-31 DIAGNOSIS — T753XXD Motion sickness, subsequent encounter: Secondary | ICD-10-CM

## 2015-01-31 DIAGNOSIS — T753XXA Motion sickness, initial encounter: Secondary | ICD-10-CM

## 2015-01-31 DIAGNOSIS — R05 Cough: Secondary | ICD-10-CM

## 2015-01-31 DIAGNOSIS — R740 Nonspecific elevation of levels of transaminase and lactic acid dehydrogenase [LDH]: Secondary | ICD-10-CM

## 2015-01-31 DIAGNOSIS — I959 Hypotension, unspecified: Secondary | ICD-10-CM

## 2015-01-31 LAB — RESPIRATORY VIRUS PANEL
Adenovirus: NEGATIVE
Influenza A: NEGATIVE
Influenza B: NEGATIVE
METAPNEUMOVIRUS: NEGATIVE
PARAINFLUENZA 1 A: NEGATIVE
Parainfluenza 2: NEGATIVE
Parainfluenza 3: NEGATIVE
RESPIRATORY SYNCYTIAL VIRUS B: NEGATIVE
RHINOVIRUS: NEGATIVE
Respiratory Syncytial Virus A: POSITIVE — AB

## 2015-01-31 LAB — HIV ANTIBODY (ROUTINE TESTING W REFLEX): HIV Screen 4th Generation wRfx: NONREACTIVE

## 2015-01-31 LAB — THYROID PEROXIDASE ANTIBODY: Thyroperoxidase Ab SerPl-aCnc: <1 IU/mL (ref <9)

## 2015-01-31 LAB — EPSTEIN-BARR VIRUS VCA ANTIBODY PANEL
EBV EA IgG: 24.9 U/mL — ABNORMAL HIGH (ref <9.0)
EBV NA IgG: 535 U/mL — ABNORMAL HIGH (ref <18.0)
EBV VCA IgM: <10 U/mL (ref <36.0)

## 2015-01-31 LAB — CBC WITH DIFFERENTIAL/PLATELET
Basophils Absolute: 0 10*3/uL (ref 0.0–0.1)
Basophils Relative: 0 % (ref 0–1)
EOS PCT: 1 % (ref 0–5)
Eosinophils Absolute: 0.1 10*3/uL (ref 0.0–0.7)
HCT: 32.1 % — ABNORMAL LOW (ref 36.0–46.0)
Hemoglobin: 10.5 g/dL — ABNORMAL LOW (ref 12.0–15.0)
LYMPHS ABS: 2.6 10*3/uL (ref 0.7–4.0)
LYMPHS PCT: 35 % (ref 12–46)
MCH: 29.1 pg (ref 26.0–34.0)
MCHC: 32.7 g/dL (ref 30.0–36.0)
MCV: 88.9 fL (ref 78.0–100.0)
MONO ABS: 1.1 10*3/uL — AB (ref 0.1–1.0)
MONOS PCT: 14 % — AB (ref 3–12)
NEUTROS ABS: 3.7 10*3/uL (ref 1.7–7.7)
Neutrophils Relative %: 50 % (ref 43–77)
Platelets: 186 10*3/uL (ref 150–400)
RBC: 3.61 MIL/uL — ABNORMAL LOW (ref 3.87–5.11)
RDW: 12.8 % (ref 11.5–15.5)
WBC: 7.5 10*3/uL (ref 4.0–10.5)

## 2015-01-31 LAB — COMPREHENSIVE METABOLIC PANEL
ALBUMIN: 2.9 g/dL — AB (ref 3.5–5.2)
ALT: 38 U/L — AB (ref 0–35)
AST: 23 U/L (ref 0–37)
Alkaline Phosphatase: 79 U/L (ref 39–117)
Anion gap: 7 (ref 5–15)
BILIRUBIN TOTAL: 0.4 mg/dL (ref 0.3–1.2)
BUN: <5 mg/dL — ABNORMAL LOW (ref 6–23)
CALCIUM: 8.1 mg/dL — AB (ref 8.4–10.5)
CO2: 24 mmol/L (ref 19–32)
Chloride: 109 mmol/L (ref 96–112)
Creatinine, Ser: 0.61 mg/dL (ref 0.50–1.10)
GFR calc Af Amer: >90 mL/min (ref >90)
GFR calc non Af Amer: >90 mL/min (ref >90)
Glucose, Bld: 89 mg/dL (ref 70–99)
Potassium: 3.4 mmol/L — ABNORMAL LOW (ref 3.5–5.1)
SODIUM: 140 mmol/L (ref 135–145)
Total Protein: 5.7 g/dL — ABNORMAL LOW (ref 6.0–8.3)

## 2015-01-31 LAB — HEPATITIS PANEL, ACUTE
HCV AB: NEGATIVE
Hep A IgM: NONREACTIVE
Hep B C IgM: NONREACTIVE
Hepatitis B Surface Ag: NEGATIVE

## 2015-01-31 LAB — MALARIA SMEAR: Special Requests: NORMAL

## 2015-01-31 LAB — THYROGLOBULIN ANTIBODY

## 2015-01-31 MED ORDER — POTASSIUM CHLORIDE CRYS ER 20 MEQ PO TBCR
40.0000 meq | EXTENDED_RELEASE_TABLET | Freq: Once | ORAL | Status: AC
  Administered 2015-01-31: 40 meq via ORAL
  Filled 2015-01-31: qty 2

## 2015-01-31 MED ORDER — ENOXAPARIN SODIUM 40 MG/0.4ML ~~LOC~~ SOLN
40.0000 mg | SUBCUTANEOUS | Status: DC
  Administered 2015-01-31 – 2015-02-01 (×2): 40 mg via SUBCUTANEOUS

## 2015-01-31 NOTE — Progress Notes (Signed)
[facility] PROGRESS NOTE  Tanya Armstrong JHE:[id number] DOB: Dec 07, 1966 DOA: 01/30/2015  PCP: Does not have a PCP  Brief HPI: 49 year old female from [place], presented with fever, chills and cough. Patient was in [place] up until Jan 21st for vacation. Her symptoms started when she was there and has persisted even after returning, although it is unclear if she did have high fevers. She initially went to the urgent care center and was prescribed azithromycin. She then presented to the emergency department with continued fever, chills and cough and was admitted for further workup and management.  Consultants: ID  Procedures: None  Antibiotics: None  Subjective: Patient feels slightly better. Breathing is improved. Cough is better. No new symptoms. Denies any dysuria.   Objective: Vital Signs  Filed Vitals:   01/30/15 0818 01/30/15 1740 01/30/15 2100 01/31/15 0500  BP: 109/72 111/61 119/79 123/73  Pulse: 89 92 99 91  Temp: 98.7 F (37.1 C) 98.6 F (37 C) 98.4 F (36.9 C) 99.6 F (37.6 C)  TempSrc: Oral Oral Oral Oral  Resp: 14 92 18 18  Weight:   50.7 kg (111 lb 12.4 oz)   SpO2: 100% 100% 97% 99%    Intake/Output Summary (Last 24 hours) at 01/31/15 1058 Last data filed at 01/31/15 0706  Gross per 24 hour  Intake 2955.42 ml  Output      0 ml  Net 2955.42 ml   Filed Weights   01/30/15 2100  Weight: 50.7 kg (111 lb 12.4 oz)     General appearance: alert, cooperative, appears stated age and no distress Resp: Decreased breath sounds at the bases but no definite crackles or wheezing. Cardio: regular rate and rhythm, S1, S2 normal, no murmur, click, rub or gallop GI: soft, non-tender; bowel sounds normal; no masses,  no organomegaly Skin: Skin color, texture, turgor normal. No rashes or lesions Neurologic: Alert and oriented X 3, normal strength and tone. Normal symmetric reflexes. Normal coordination and gait  Lab Results:  Basic Metabolic Panel:  Recent  Labs Lab 01/30/15 0011 01/31/15 0550  NA 135 140  K 3.8 3.4*  CL 103 109  CO2 26 24  GLUCOSE 225* 89  BUN 6 <5*  CREATININE 0.69 0.61  CALCIUM 9.2 8.1*   Liver Function Tests:  Recent Labs Lab 01/30/15 0011 01/31/15 0550  AST 36 23  ALT 41* 38*  ALKPHOS 86 79  BILITOT 0.8 0.4  PROT 7.2 5.7*  ALBUMIN 3.4* 2.9*   CBC:  Recent Labs Lab 01/30/15 0011 01/31/15 0550  WBC 12.3* 7.5  NEUTROABS 9.7* 3.7  HGB 11.6* 10.5*  HCT 35.2* 32.1*  MCV 88.2 88.9  PLT 195 186     Studies/Results: Ct Chest W Contrast  01/30/2015   CLINICAL DATA:  Fever of unknown origin.  EXAM: CT CHEST, ABDOMEN, AND PELVIS WITH CONTRAST  TECHNIQUE: Multidetector CT imaging of the chest, abdomen and pelvis was performed following the standard protocol during bolus administration of intravenous contrast.  CONTRAST:  191mL OMNIPAQUE IOHEXOL 300 MG/ML  SOLN  COMPARISON:  Plain film of 01/30/2015 chest.  No prior CT.  FINDINGS: CT CHEST FINDINGS  Mediastinum/Nodes: Right greater than left thyroid nodules, as detailed on today's ultrasound. Right-sided thyroid enlargement displaces the trachea to the left. Heart size upper normal, accentuated by a mild pectus excavatum deformity. No pericardial effusion. No mediastinal or hilar adenopathy.  Lungs/Pleura: Mild motion degradation throughout. 4 mm right middle lobe lung nodule on image 27.  2 mm left lower lobe  lung nodule on image 30. No lobar consolidation.  Trace bilateral pleural fluid, possibly physiologic.  Musculoskeletal: No acute osseous abnormality.  CT ABDOMEN PELVIS FINDINGS  Hepatobiliary: Normal liver. Normal gallbladder, without biliary ductal dilatation.  Pancreas: Normal, without mass or ductal dilatation.  Spleen: Normal  Adrenals/Urinary Tract: Normal adrenal glands. Normal left kidney. Heterogeneous enhancement involves the right kidney, including on images 52 through 68 of series 201. No hydronephrosis. Normal urinary bladder.  Stomach/Bowel: Normal  stomach, without wall thickening. Normal colon, appendix, and terminal ileum. Normal small bowel.  Vascular/Lymphatic: Normal caliber of the aorta and branch vessels. No abdominopelvic adenopathy.  Reproductive: Uterine fundal mass measures 7.3 x 7.8 cm on sagittal image 67. No adnexal mass.  Other: Trace free pelvic fluid is likely physiologic.  Musculoskeletal: No acute osseous abnormality.  IMPRESSION: 1. Right-sided heterogeneous renal enhancement, most consistent with pyelonephritis. No other explanation for fever. 2. Mild motion degradation throughout. 3. Dominant uterine fundal fibroid. 4. Trace bilateral pleural fluid, possibly physiologic. 5. Pulmonary nodules up to 4 mm. If the patient is at high risk for bronchogenic carcinoma, follow-up chest CT at 1 year is recommended. If the patient is at low risk, no follow-up is needed. This recommendation follows the consensus statement: "Guidelines for Management of Small Pulmonary Nodules Detected on CT Scans: A Statement from the [employer]" as published in Radiology 2005; 237:395-400. Available online at: [email or web address].[email or web address]/.   Electronically Signed   By: Abigail Miyamoto M.D.   On: 01/30/2015 19:09   US Soft Tissue Head/neck  01/30/2015   CLINICAL DATA:  Thyroid cyst  EXAM: THYROID ULTRASOUND  TECHNIQUE: Ultrasound examination of the thyroid gland and adjacent soft tissues was performed.  COMPARISON:  01/26/2008  FINDINGS: Right thyroid lobe  Measurements: 6.5 x 3.2 x 3.6 cm. There are complex, primarily cystic nodules throughout the right lobe.  In the upper pole, the largest multi cystic nodule measures 2.7 x 1.3 x 1.7 cm. In the right lobe this is the nodule with the most solid component and is in the upper pole, and appears similar to 2009 when accounting for measurement error.  In the right lower pole there is a cluster of 3 cystic nodules with septations and solid areas, 1.4, 2.1, and 3.1 cm in maximal diameter.  Some of the cysts contain echogenic foci with ring down artifact consistent with colloid.  Left thyroid lobe  Measurements: 5.5 x 1.3 x 1.6 cm. There are 2 measured complex cystic lesions, 2.1 x 1 x 1 cm in the upper pole and 8 x 5 x 7 mm in the lower pole. The upper pole lesion appears mildly larger compared to 2009, now 2.1 cm, previously 1.3 cm. This solid and cystic nodule appears to have been biopsied in 2009.  Isthmus  Thickness: 5 mm. There is a cystic 18 mm lesion in the right isthmus with colloid type internal echoes.  Lymphadenopathy  None visualized.  IMPRESSION: Numerous bilateral thyroid nodules, primarily cystic. Nodule size has varied since 2009, but there is no dominant growing nodule.   Electronically Signed   By: Jorje Guild M.D.   On: 01/30/2015 07:49   Ct Abdomen Pelvis W Contrast  01/30/2015   CLINICAL DATA:  Fever of unknown origin.  EXAM: CT CHEST, ABDOMEN, AND PELVIS WITH CONTRAST  TECHNIQUE: Multidetector CT imaging of the chest, abdomen and pelvis was performed following the standard protocol during bolus administration of intravenous contrast.  CONTRAST:  164mL OMNIPAQUE IOHEXOL 300 MG/ML  SOLN  COMPARISON:  Plain  film of 01/30/2015 chest.  No prior CT.  FINDINGS: CT CHEST FINDINGS  Mediastinum/Nodes: Right greater than left thyroid nodules, as detailed on today's ultrasound. Right-sided thyroid enlargement displaces the trachea to the left. Heart size upper normal, accentuated by a mild pectus excavatum deformity. No pericardial effusion. No mediastinal or hilar adenopathy.  Lungs/Pleura: Mild motion degradation throughout. 4 mm right middle lobe lung nodule on image 27.  2 mm left lower lobe lung nodule on image 30. No lobar consolidation.  Trace bilateral pleural fluid, possibly physiologic.  Musculoskeletal: No acute osseous abnormality.  CT ABDOMEN PELVIS FINDINGS  Hepatobiliary: Normal liver. Normal gallbladder, without biliary ductal dilatation.  Pancreas: Normal, without mass  or ductal dilatation.  Spleen: Normal  Adrenals/Urinary Tract: Normal adrenal glands. Normal left kidney. Heterogeneous enhancement involves the right kidney, including on images 52 through 68 of series 201. No hydronephrosis. Normal urinary bladder.  Stomach/Bowel: Normal stomach, without wall thickening. Normal colon, appendix, and terminal ileum. Normal small bowel.  Vascular/Lymphatic: Normal caliber of the aorta and branch vessels. No abdominopelvic adenopathy.  Reproductive: Uterine fundal mass measures 7.3 x 7.8 cm on sagittal image 67. No adnexal mass.  Other: Trace free pelvic fluid is likely physiologic.  Musculoskeletal: No acute osseous abnormality.  IMPRESSION: 1. Right-sided heterogeneous renal enhancement, most consistent with pyelonephritis. No other explanation for fever. 2. Mild motion degradation throughout. 3. Dominant uterine fundal fibroid. 4. Trace bilateral pleural fluid, possibly physiologic. 5. Pulmonary nodules up to 4 mm. If the patient is at high risk for bronchogenic carcinoma, follow-up chest CT at 1 year is recommended. If the patient is at low risk, no follow-up is needed. This recommendation follows the consensus statement: "Guidelines for Management of Small Pulmonary Nodules Detected on CT Scans: A Statement from the [place]" as published in Radiology 2005; 237:395-400. Available online at: [email or web address].com/.   Electronically Signed   By: Abigail Miyamoto M.D.   On: 01/30/2015 19:09   Dg Chest Port 1 View  01/30/2015   CLINICAL DATA:  Fevers, and chills, tremor for 2-3 days, diaphoresis. Recent trip to the [place]. The on antibiotics.  EXAM: PORTABLE CHEST - 1 VIEW  COMPARISON:  None.  FINDINGS: Cardiomediastinal silhouette is unremarkable. Strandy densities in the lung bases. No pleural effusion. No pneumothorax. Trachea is displaced to the LEFT at the thoracic inlet. Soft tissue planes and included osseous structures are nonacute   IMPRESSION: RIGHT lung base probable atelectasis.  Trachea displaced to the LEFT, infers thyroid mass, status post thyroid biopsy February 23, 2008.   Electronically Signed   By: Elon Alas   On: 01/30/2015 01:39    Medications:  Scheduled: . enoxaparin (LOVENOX) injection  40 mg Subcutaneous Q24H  . potassium chloride  40 mEq Oral Once  . sodium chloride  3 mL Intravenous Q12H   Continuous: . sodium chloride 125 mL/hr (01/31/15 0706)   ONG:[id number] **OR** acetaminophen, ondansetron **OR** ondansetron (ZOFRAN) IV  Assessment/Plan:  Principal Problem:   SIRS (systemic inflammatory response syndrome) Active Problems:   Fever   Thyroid cyst   Cough   FUO (fever of unknown origin)   Arterial hypotension   Transaminitis   Travel sickness   Pyrexia    Fever with SIRS (systemic inflammatory response syndrome) Etiology remains unclear. Fever appears to be subsiding. CT scan of the chest, abdomen, pelvis does not show any obvious infectious source. Abnormal appearing kidney was noted. However, patient is not symptomatic. This is unlikely to be a UTI or pyelonephritis. Lactic acid  is normal. Appears to be respiratory tract infection. Chest x-ray did not show any clear-cut infiltrate or other acute process. Recent travel to [place] confounds diagnosis. Initial malarial smear was negative.  Continue droplet precaution. She is being observed off of antibiotics currently. Influenza PCR was negative. Appreciate ID input. Other culture reports are still pending. WBC is normal today.  Multiple Thyroid nodules with tracheal deviation Noted on ultrasound. Not much change compared to previous. No respiratory compromise. Her TSH was low but FT4 is normal. This can be further pursued as an outpatient. She did undergo a biopsy in 2009 which showed cystic material.  Incidental uterine fibroid Outpatient follow-up. Asymptomatic  Incidental pulmonary nodules on CT Outpatient  follow-up.  Hypokalemia Will be repleted  DVT Prophylaxis: Lovenox    Code Status: Full code  Family Communication: Discussed with the patient and her husband  Disposition Plan: Not ready for discharge    LOS: 1 day   [facility] Pager [phone] 01/31/2015, 10:58 AM  If 7PM-7AM, please contact night-coverage at [email or web address], password [facility]

## 2015-01-31 NOTE — Progress Notes (Signed)
[facility]    Subjective: No new complaints wants to go home   Antibiotics:  Anti-infectives    None      Medications: Scheduled Meds: . enoxaparin (LOVENOX) injection  40 mg Subcutaneous Q24H  . sodium chloride  3 mL Intravenous Q12H   Continuous Infusions: . sodium chloride 50 mL/hr at 01/31/15 1215   PRN Meds:.acetaminophen **OR** acetaminophen, ondansetron **OR** ondansetron (ZOFRAN) IV    Objective: Weight change:   Intake/Output Summary (Last 24 hours) at 01/31/15 1532 Last data filed at 01/31/15 0706  Gross per 24 hour  Intake 2835.42 ml  Output      0 ml  Net 2835.42 ml   Blood pressure 123/73, pulse 91, temperature 99.6 F (37.6 C), temperature source Oral, resp. rate 18, weight 111 lb 12.4 oz (50.7 kg), last menstrual period 01/23/2015, SpO2 99 %. Temp:  [98.4 F (36.9 C)-99.6 F (37.6 C)] 99.6 F (37.6 C) (02/02 0500) Pulse Rate:  [91-99] 91 (02/02 0500) Resp:  [18-92] 18 (02/02 0500) BP: (111-123)/(61-79) 123/73 mmHg (02/02 0500) SpO2:  [97 %-100 %] 99 % (02/02 0500) Weight:  [111 lb 12.4 oz (50.7 kg)] 111 lb 12.4 oz (50.7 kg) (02/01 2100)  Physical Exam: General: Alert and awake, oriented x3, not in any acute distress. HEENT: anicteric sclera EOMI, oropharynx clear and without exudate CVS regular rate, normal r, no murmur rubs or gallops Chest: clear to auscultation bilaterally, no wheezing, rales or rhonchi Abdomen: soft nontender, nondistended, normal bowel sounds, Extremities: no clubbing or edema noted bilaterally Skin: no rashes. Right arm was slight hypopigmentation but this is chronic Neuro: nonfocal, strength and sensation intact    CBC:  CBC Latest Ref Rng 01/31/2015 01/30/2015  WBC 4.0 - 10.5 K/uL 7.5 12.3(H)  Hemoglobin 12.0 - 15.0 g/dL 10.5(L) 11.6(L)  Hematocrit 36.0 - 46.0 % 32.1(L) 35.2(L)  Platelets 150 - 400 K/uL 186 195     BMET  Recent Labs  01/30/15 0011 01/31/15 0550  NA 135  140  K 3.8 3.4*  CL 103 109  CO2 26 24  GLUCOSE 225* 89  BUN 6 <5*  CREATININE 0.69 0.61  CALCIUM 9.2 8.1*     Liver Panel   Recent Labs  01/30/15 0011 01/31/15 0550  PROT 7.2 5.7*  ALBUMIN 3.4* 2.9*  AST 36 23  ALT 41* 38*  ALKPHOS 86 79  BILITOT 0.8 0.4       Sedimentation Rate  Recent Labs  01/30/15 0518  ESRSEDRATE 50*   C-Reactive Protein  Recent Labs  01/30/15 0518  CRP 7.1*    Micro Results: Recent Results (from the past 720 hour(s))  Culture, blood (routine x 2)     Status: None (Preliminary result)   Collection Time: 01/30/15 12:14 AM  Result Value Ref Range Status   Specimen Description BLOOD LEFT ANTECUBITAL  Final   Special Requests BOTTLES DRAWN AEROBIC AND ANAEROBIC 10CC EACH  Final   Culture   Final           BLOOD CULTURE RECEIVED NO GROWTH TO DATE CULTURE WILL BE HELD FOR 5 DAYS BEFORE ISSUING A FINAL NEGATIVE REPORT Note: Culture results may be compromised due to an excessive volume of blood received in culture bottles. Performed at [facility]    Report Status PENDING  Incomplete  Culture, blood (routine x 2)     Status: None (Preliminary result)   Collection Time: 01/30/15 12:18 AM  Result Value Ref Range Status  Specimen Description BLOOD RIGHT ANTECUBITAL  Final   Special Requests BOTTLES DRAWN AEROBIC AND ANAEROBIC 5CC EACH  Final   Culture   Final           BLOOD CULTURE RECEIVED NO GROWTH TO DATE CULTURE WILL BE HELD FOR 5 DAYS BEFORE ISSUING A FINAL NEGATIVE REPORT Performed at [facility]    Report Status PENDING  Incomplete  Urine culture     Status: None (Preliminary result)   Collection Time: 01/30/15  1:10 AM  Result Value Ref Range Status   Specimen Description URINE, CLEAN CATCH  Final   Special Requests NONE  Final   Colony Count PENDING  Incomplete   Culture   Final    Culture reincubated for better growth Performed at [facility]    Report Status PENDING  Incomplete  Malaria  smear     Status: None   Collection Time: 01/30/15  6:36 AM  Result Value Ref Range Status   Specimen Description Blood  Final   Special Requests Normal  Final   Malaria Prep   Final    No Plasmodium or Other Blood Parasites Seen on Thick or Thin Smears For persons strongly suspected of having a blood parasite,but have negative smears, it is recommended that blood films be repeated approximately every 12 to 24 hours for 3 consecutive  days. Performed at [facility]    Report Status 01/31/2015 FINAL  Final    Studies/Results: Ct Chest W Contrast  01/30/2015   CLINICAL DATA:  Fever of unknown origin.  EXAM: CT CHEST, ABDOMEN, AND PELVIS WITH CONTRAST  TECHNIQUE: Multidetector CT imaging of the chest, abdomen and pelvis was performed following the standard protocol during bolus administration of intravenous contrast.  CONTRAST:  184mL OMNIPAQUE IOHEXOL 300 MG/ML  SOLN  COMPARISON:  Plain film of 01/30/2015 chest.  No prior CT.  FINDINGS: CT CHEST FINDINGS  Mediastinum/Nodes: Right greater than left thyroid nodules, as detailed on today's ultrasound. Right-sided thyroid enlargement displaces the trachea to the left. Heart size upper normal, accentuated by a mild pectus excavatum deformity. No pericardial effusion. No mediastinal or hilar adenopathy.  Lungs/Pleura: Mild motion degradation throughout. 4 mm right middle lobe lung nodule on image 27.  2 mm left lower lobe lung nodule on image 30. No lobar consolidation.  Trace bilateral pleural fluid, possibly physiologic.  Musculoskeletal: No acute osseous abnormality.  CT ABDOMEN PELVIS FINDINGS  Hepatobiliary: Normal liver. Normal gallbladder, without biliary ductal dilatation.  Pancreas: Normal, without mass or ductal dilatation.  Spleen: Normal  Adrenals/Urinary Tract: Normal adrenal glands. Normal left kidney. Heterogeneous enhancement involves the right kidney, including on images 52 through 68 of series 201. No hydronephrosis. Normal urinary  bladder.  Stomach/Bowel: Normal stomach, without wall thickening. Normal colon, appendix, and terminal ileum. Normal small bowel.  Vascular/Lymphatic: Normal caliber of the aorta and branch vessels. No abdominopelvic adenopathy.  Reproductive: Uterine fundal mass measures 7.3 x 7.8 cm on sagittal image 67. No adnexal mass.  Other: Trace free pelvic fluid is likely physiologic.  Musculoskeletal: No acute osseous abnormality.  IMPRESSION: 1. Right-sided heterogeneous renal enhancement, most consistent with pyelonephritis. No other explanation for fever. 2. Mild motion degradation throughout. 3. Dominant uterine fundal fibroid. 4. Trace bilateral pleural fluid, possibly physiologic. 5. Pulmonary nodules up to 4 mm. If the patient is at high risk for bronchogenic carcinoma, follow-up chest CT at 1 year is recommended. If the patient is at low risk, no follow-up is needed. This recommendation follows  the consensus statement: "Guidelines for Management of Small Pulmonary Nodules Detected on CT Scans: A Statement from the [place]" as published in Radiology 2005; 237:395-400. Available online at: [email or web address]   Electronically Signed   By: Abigail Miyamoto M.D.   On: 01/30/2015 19:09   US Soft Tissue Head/neck  01/30/2015   CLINICAL DATA:  Thyroid cyst  EXAM: THYROID ULTRASOUND  TECHNIQUE: Ultrasound examination of the thyroid gland and adjacent soft tissues was performed.  COMPARISON:  01/26/2008  FINDINGS: Right thyroid lobe  Measurements: 6.5 x 3.2 x 3.6 cm. There are complex, primarily cystic nodules throughout the right lobe.  In the upper pole, the largest multi cystic nodule measures 2.7 x 1.3 x 1.7 cm. In the right lobe this is the nodule with the most solid component and is in the upper pole, and appears similar to 2009 when accounting for measurement error.  In the right lower pole there is a cluster of 3 cystic nodules with septations and solid areas, 1.4, 2.1,  and 3.1 cm in maximal diameter. Some of the cysts contain echogenic foci with ring down artifact consistent with colloid.  Left thyroid lobe  Measurements: 5.5 x 1.3 x 1.6 cm. There are 2 measured complex cystic lesions, 2.1 x 1 x 1 cm in the upper pole and 8 x 5 x 7 mm in the lower pole. The upper pole lesion appears mildly larger compared to 2009, now 2.1 cm, previously 1.3 cm. This solid and cystic nodule appears to have been biopsied in 2009.  Isthmus  Thickness: 5 mm. There is a cystic 18 mm lesion in the right isthmus with colloid type internal echoes.  Lymphadenopathy  None visualized.  IMPRESSION: Numerous bilateral thyroid nodules, primarily cystic. Nodule size has varied since 2009, but there is no dominant growing nodule.   Electronically Signed   By: Jorje Guild M.D.   On: 01/30/2015 07:49   Ct Abdomen Pelvis W Contrast  01/30/2015   CLINICAL DATA:  Fever of unknown origin.  EXAM: CT CHEST, ABDOMEN, AND PELVIS WITH CONTRAST  TECHNIQUE: Multidetector CT imaging of the chest, abdomen and pelvis was performed following the standard protocol during bolus administration of intravenous contrast.  CONTRAST:  155mL OMNIPAQUE IOHEXOL 300 MG/ML  SOLN  COMPARISON:  Plain film of 01/30/2015 chest.  No prior CT.  FINDINGS: CT CHEST FINDINGS  Mediastinum/Nodes: Right greater than left thyroid nodules, as detailed on today's ultrasound. Right-sided thyroid enlargement displaces the trachea to the left. Heart size upper normal, accentuated by a mild pectus excavatum deformity. No pericardial effusion. No mediastinal or hilar adenopathy.  Lungs/Pleura: Mild motion degradation throughout. 4 mm right middle lobe lung nodule on image 27.  2 mm left lower lobe lung nodule on image 30. No lobar consolidation.  Trace bilateral pleural fluid, possibly physiologic.  Musculoskeletal: No acute osseous abnormality.  CT ABDOMEN PELVIS FINDINGS  Hepatobiliary: Normal liver. Normal gallbladder, without biliary ductal dilatation.   Pancreas: Normal, without mass or ductal dilatation.  Spleen: Normal  Adrenals/Urinary Tract: Normal adrenal glands. Normal left kidney. Heterogeneous enhancement involves the right kidney, including on images 52 through 68 of series 201. No hydronephrosis. Normal urinary bladder.  Stomach/Bowel: Normal stomach, without wall thickening. Normal colon, appendix, and terminal ileum. Normal small bowel.  Vascular/Lymphatic: Normal caliber of the aorta and branch vessels. No abdominopelvic adenopathy.  Reproductive: Uterine fundal mass measures 7.3 x 7.8 cm on sagittal image 67. No adnexal mass.  Other: Trace free pelvic fluid is likely physiologic.  Musculoskeletal:  No acute osseous abnormality.  IMPRESSION: 1. Right-sided heterogeneous renal enhancement, most consistent with pyelonephritis. No other explanation for fever. 2. Mild motion degradation throughout. 3. Dominant uterine fundal fibroid. 4. Trace bilateral pleural fluid, possibly physiologic. 5. Pulmonary nodules up to 4 mm. If the patient is at high risk for bronchogenic carcinoma, follow-up chest CT at 1 year is recommended. If the patient is at low risk, no follow-up is needed. This recommendation follows the consensus statement: "Guidelines for Management of Small Pulmonary Nodules Detected on CT Scans: A Statement from the [place]" as published in Radiology 2005; 237:395-400. Available online at: [email or web address]   Electronically Signed   By: Abigail Miyamoto M.D.   On: 01/30/2015 19:09   Dg Chest Port 1 View  01/30/2015   CLINICAL DATA:  Fevers, and chills, tremor for 2-3 days, diaphoresis. [place]. The on antibiotics.  EXAM: PORTABLE CHEST - 1 VIEW  COMPARISON:  None.  FINDINGS: Cardiomediastinal silhouette is unremarkable. Strandy densities in the lung bases. No pleural effusion. No pneumothorax. Trachea is displaced to the LEFT at the thoracic inlet. Soft tissue planes and included  osseous structures are nonacute  IMPRESSION: RIGHT lung base probable atelectasis.  Trachea displaced to the LEFT, infers thyroid mass, status post thyroid biopsy February 23, 2008.   Electronically Signed   By: Elon Alas   On: 01/30/2015 01:39      Assessment/Plan:  Principal Problem:   SIRS (systemic inflammatory response syndrome) Active Problems:   Fever   Thyroid cyst   Cough   FUO (fever of unknown origin)   Arterial hypotension   Transaminitis   Travel sickness   Pyrexia    Tanya Armstrong is a 49 y.o. female  with FUO after returning from [place].  #1 FUO in returning traveller: Still  Had concerns for Malaria and therefore repeated her smear. Dengue possible though does not seem likely. Not good fit for Chikungunya either I doubt Japanese Encephalitis. She has serological evidence of past EBV infection not acute infection. She does not have hep A, b or C. HIV is pending  Resp panel PCR pending  --followup repeat malaria smear --if having loose bowel movements send stool for culture and O and P --will check respiratory virus panel PCR --would resend blood cultures if still febrile   She is appearing well today. If she continues to do well would DC to home in the am with followup with [place] in the [place].    LOS: 1 day   Alcide Evener 01/31/2015, 3:32 PM

## 2015-02-01 ENCOUNTER — Inpatient Hospital Stay (HOSPITAL_COMMUNITY): Payer: BLUE CROSS/BLUE SHIELD

## 2015-02-01 DIAGNOSIS — J210 Acute bronchiolitis due to respiratory syncytial virus: Secondary | ICD-10-CM | POA: Insufficient documentation

## 2015-02-01 DIAGNOSIS — R5081 Fever presenting with conditions classified elsewhere: Secondary | ICD-10-CM

## 2015-02-01 DIAGNOSIS — B974 Respiratory syncytial virus as the cause of diseases classified elsewhere: Secondary | ICD-10-CM

## 2015-02-01 LAB — CBC
HEMATOCRIT: 33.2 % — AB (ref 36.0–46.0)
HEMOGLOBIN: 10.9 g/dL — AB (ref 12.0–15.0)
MCH: 28.9 pg (ref 26.0–34.0)
MCHC: 32.8 g/dL (ref 30.0–36.0)
MCV: 88.1 fL (ref 78.0–100.0)
Platelets: 211 10*3/uL (ref 150–400)
RBC: 3.77 MIL/uL — ABNORMAL LOW (ref 3.87–5.11)
RDW: 12.7 % (ref 11.5–15.5)
WBC: 8.1 10*3/uL (ref 4.0–10.5)

## 2015-02-01 LAB — QUANTIFERON IN TUBE
QFT TB AG MINUS NIL VALUE: 0 IU/mL
QUANTIFERON MITOGEN VALUE: 0.82 IU/mL
QUANTIFERON NIL VALUE: 0.09 [IU]/mL
QUANTIFERON TB AG VALUE: 0.09 IU/mL
QUANTIFERON TB GOLD: NEGATIVE

## 2015-02-01 LAB — BASIC METABOLIC PANEL
Anion gap: 5 (ref 5–15)
BUN: <5 mg/dL — ABNORMAL LOW (ref 6–23)
CALCIUM: 8.8 mg/dL (ref 8.4–10.5)
CO2: 27 mmol/L (ref 19–32)
Chloride: 108 mmol/L (ref 96–112)
Creatinine, Ser: 0.55 mg/dL (ref 0.50–1.10)
GFR calc non Af Amer: >90 mL/min (ref >90)
GLUCOSE: 97 mg/dL (ref 70–99)
POTASSIUM: 3.7 mmol/L (ref 3.5–5.1)
Sodium: 140 mmol/L (ref 135–145)

## 2015-02-01 LAB — CMV ANTIBODY, IGG (EIA)

## 2015-02-01 LAB — QUANTIFERON TB GOLD ASSAY (BLOOD)

## 2015-02-01 LAB — MALARIA SMEAR: Special Requests: NORMAL

## 2015-02-01 LAB — CMV IGM

## 2015-02-01 NOTE — Progress Notes (Addendum)
[facility] PROGRESS NOTE  Tanya Armstrong FHL:[id number] DOB: Sep 01, 1966 DOA: 01/30/2015  PCP: Does not have a PCP  Brief HPI: 49 year old female from [place], presented with fever, chills and cough. Patient was in [place] up until Jan 21st for vacation. Her symptoms started when she was there and has persisted even after returning, although it is unclear if she did have high fevers. She initially went to the urgent care center and was prescribed azithromycin. She then presented to the emergency department with continued fever, chills and cough and was admitted for further workup and management.  Consultants: ID  Procedures: None  Antibiotics: None  Subjective: Febrile, no chills  ,no   Diarrhea   Objective: Vital Signs  Filed Vitals:   02/01/15 0500 02/01/15 0951 02/01/15 1110 02/01/15 1211  BP: 108/63 117/74  112/58  Pulse: 86 96  124  Temp: 98.3 F (36.8 C) 98.7 F (37.1 C) 98.2 F (36.8 C) 103.2 F (39.6 C)  TempSrc: Oral Oral Oral Oral  Resp: 16 16  16   Weight:      SpO2: 100% 100%  98%    Intake/Output Summary (Last 24 hours) at 02/01/15 1224 Last data filed at 02/01/15 1100  Gross per 24 hour  Intake   1280 ml  Output      0 ml  Net   1280 ml   Filed Weights   01/30/15 2100 01/31/15 2100  Weight: 50.7 kg (111 lb 12.4 oz) 50.87 kg (112 lb 2.4 oz)     General appearance: alert, cooperative, appears stated age and no distress Resp: Decreased breath sounds at the bases but no definite crackles or wheezing. Cardio: regular rate and rhythm, S1, S2 normal, no murmur, click, rub or gallop GI: soft, non-tender; bowel sounds normal; no masses,  no organomegaly Skin: Skin color, texture, turgor normal. No rashes or lesions Neurologic: Alert and oriented X 3, normal strength and tone. Normal symmetric reflexes. Normal coordination and gait  Lab Results:  Basic Metabolic Panel:  Recent Labs Lab 01/30/15 0011 01/31/15 0550 02/01/15 0745  NA 135 140  140  K 3.8 3.4* 3.7  CL 103 109 108  CO2 26 24 27   GLUCOSE 225* 89 97  BUN 6 <5* <5*  CREATININE 0.69 0.61 0.55  CALCIUM 9.2 8.1* 8.8   Liver Function Tests:  Recent Labs Lab 01/30/15 0011 01/31/15 0550  AST 36 23  ALT 41* 38*  ALKPHOS 86 79  BILITOT 0.8 0.4  PROT 7.2 5.7*  ALBUMIN 3.4* 2.9*   CBC:  Recent Labs Lab 01/30/15 0011 01/31/15 0550 02/01/15 0745  WBC 12.3* 7.5 8.1  NEUTROABS 9.7* 3.7  --   HGB 11.6* 10.5* 10.9*  HCT 35.2* 32.1* 33.2*  MCV 88.2 88.9 88.1  PLT 195 186 211     Studies/Results: Ct Chest W Contrast  01/30/2015   CLINICAL DATA:  Fever of unknown origin.  EXAM: CT CHEST, ABDOMEN, AND PELVIS WITH CONTRAST  TECHNIQUE: Multidetector CT imaging of the chest, abdomen and pelvis was performed following the standard protocol during bolus administration of intravenous contrast.  CONTRAST:  138mL OMNIPAQUE IOHEXOL 300 MG/ML  SOLN  COMPARISON:  Plain film of 01/30/2015 chest.  No prior CT.  FINDINGS: CT CHEST FINDINGS  Mediastinum/Nodes: Right greater than left thyroid nodules, as detailed on today's ultrasound. Right-sided thyroid enlargement displaces the trachea to the left. Heart size upper normal, accentuated by a mild pectus excavatum deformity. No pericardial effusion. No mediastinal or hilar adenopathy.  Lungs/Pleura: Mild  motion degradation throughout. 4 mm right middle lobe lung nodule on image 27.  2 mm left lower lobe lung nodule on image 30. No lobar consolidation.  Trace bilateral pleural fluid, possibly physiologic.  Musculoskeletal: No acute osseous abnormality.  CT ABDOMEN PELVIS FINDINGS  Hepatobiliary: Normal liver. Normal gallbladder, without biliary ductal dilatation.  Pancreas: Normal, without mass or ductal dilatation.  Spleen: Normal  Adrenals/Urinary Tract: Normal adrenal glands. Normal left kidney. Heterogeneous enhancement involves the right kidney, including on images 52 through 68 of series 201. No hydronephrosis. Normal urinary bladder.   Stomach/Bowel: Normal stomach, without wall thickening. Normal colon, appendix, and terminal ileum. Normal small bowel.  Vascular/Lymphatic: Normal caliber of the aorta and branch vessels. No abdominopelvic adenopathy.  Reproductive: Uterine fundal mass measures 7.3 x 7.8 cm on sagittal image 67. No adnexal mass.  Other: Trace free pelvic fluid is likely physiologic.  Musculoskeletal: No acute osseous abnormality.  IMPRESSION: 1. Right-sided heterogeneous renal enhancement, most consistent with pyelonephritis. No other explanation for fever. 2. Mild motion degradation throughout. 3. Dominant uterine fundal fibroid. 4. Trace bilateral pleural fluid, possibly physiologic. 5. Pulmonary nodules up to 4 mm. If the patient is at high risk for bronchogenic carcinoma, follow-up chest CT at 1 year is recommended. If the patient is at low risk, no follow-up is needed. This recommendation follows the consensus statement: "Guidelines for Management of Small Pulmonary Nodules Detected on CT Scans: A Statement from the [place]" as published in Radiology 2005; 237:395-400. Available online at: [email or web address]   Electronically Signed   By: Abigail Miyamoto M.D.   On: 01/30/2015 19:09   Ct Abdomen Pelvis W Contrast  01/30/2015   CLINICAL DATA:  Fever of unknown origin.  EXAM: CT CHEST, ABDOMEN, AND PELVIS WITH CONTRAST  TECHNIQUE: Multidetector CT imaging of the chest, abdomen and pelvis was performed following the standard protocol during bolus administration of intravenous contrast.  CONTRAST:  181mL OMNIPAQUE IOHEXOL 300 MG/ML  SOLN  COMPARISON:  Plain film of 01/30/2015 chest.  No prior CT.  FINDINGS: CT CHEST FINDINGS  Mediastinum/Nodes: Right greater than left thyroid nodules, as detailed on today's ultrasound. Right-sided thyroid enlargement displaces the trachea to the left. Heart size upper normal, accentuated by a mild pectus excavatum deformity. No pericardial effusion. No  mediastinal or hilar adenopathy.  Lungs/Pleura: Mild motion degradation throughout. 4 mm right middle lobe lung nodule on image 27.  2 mm left lower lobe lung nodule on image 30. No lobar consolidation.  Trace bilateral pleural fluid, possibly physiologic.  Musculoskeletal: No acute osseous abnormality.  CT ABDOMEN PELVIS FINDINGS  Hepatobiliary: Normal liver. Normal gallbladder, without biliary ductal dilatation.  Pancreas: Normal, without mass or ductal dilatation.  Spleen: Normal  Adrenals/Urinary Tract: Normal adrenal glands. Normal left kidney. Heterogeneous enhancement involves the right kidney, including on images 52 through 68 of series 201. No hydronephrosis. Normal urinary bladder.  Stomach/Bowel: Normal stomach, without wall thickening. Normal colon, appendix, and terminal ileum. Normal small bowel.  Vascular/Lymphatic: Normal caliber of the aorta and branch vessels. No abdominopelvic adenopathy.  Reproductive: Uterine fundal mass measures 7.3 x 7.8 cm on sagittal image 67. No adnexal mass.  Other: Trace free pelvic fluid is likely physiologic.  Musculoskeletal: No acute osseous abnormality.  IMPRESSION: 1. Right-sided heterogeneous renal enhancement, most consistent with pyelonephritis. No other explanation for fever. 2. Mild motion degradation throughout. 3. Dominant uterine fundal fibroid. 4. Trace bilateral pleural fluid, possibly physiologic. 5. Pulmonary nodules up to 4 mm. If the patient is at high risk  for bronchogenic carcinoma, follow-up chest CT at 1 year is recommended. If the patient is at low risk, no follow-up is needed. This recommendation follows the consensus statement: "Guidelines for Management of Small Pulmonary Nodules Detected on CT Scans: A Statement from the [place]" as published in Radiology 2005; 237:395-400. Available online at: [email or web address]/.   Electronically Signed   By: Abigail Miyamoto M.D.   On: 01/30/2015 19:09     Medications:  Scheduled: . enoxaparin (LOVENOX) injection  40 mg Subcutaneous Q24H  . sodium chloride  3 mL Intravenous Q12H   Continuous: . sodium chloride 150 mL/hr at 02/01/15 1223   TIR:[id number] **OR** acetaminophen, ondansetron **OR** ondansetron (ZOFRAN) IV  Assessment/Plan:  Principal Problem:   SIRS (systemic inflammatory response syndrome) Active Problems:   Fever   Thyroid cyst   Cough   FUO (fever of unknown origin)   Arterial hypotension   Transaminitis   Travel sickness   Pyrexia    Fever with SIRS (systemic inflammatory response syndrome) Another fever spike 2/3 CT scan of the chest, abdomen, pelvis does not show any obvious infectious source. Abnormal appearing kidney was noted. However, patient is not symptomatic.  Influenza PCR negative Resp panel PCR RSV positive  hep A, b or C. HIV negative   Chest x-ray did not show any clear-cut infiltrate or other acute process.  Initial malarial smear was negative.  Continue droplet precaution. She is being observed off of antibiotics currently.  Repeat CXR today, BC *2, hydrate and await further ID recommendations serological evidence of past EBV infection not acute infection  Multiple Thyroid nodules with tracheal deviation Noted on ultrasound 2/1 . TSH was low but FT4 is normal. This can be further pursued as an outpatient. She did undergo a biopsy in 2009 which showed cystic material.  Incidental uterine fibroid Outpatient follow-up. Asymptomatic  Incidental pulmonary nodules on CT Pulmonary nodules up to 4 mm, Outpatient follow-up.  Hypokalemia Will be repleted  DVT Prophylaxis: Lovenox    Code Status: Full code  Family Communication: Discussed with the patient and her husband  Disposition Plan:per ID     LOS: 2 days   [facility]  [facility] Pager [phone]  02/01/2015, 12:24 PM  If 7PM-7AM, please contact night-coverage at [email or web address], password [facility]

## 2015-02-01 NOTE — Progress Notes (Signed)
[facility]    Subjective: Had fever last night   Antibiotics:  Anti-infectives    None      Medications: Scheduled Meds: . enoxaparin (LOVENOX) injection  40 mg Subcutaneous Q24H  . sodium chloride  3 mL Intravenous Q12H   Continuous Infusions: . sodium chloride 150 mL/hr at 02/01/15 2052   PRN Meds:.acetaminophen **OR** acetaminophen, ondansetron **OR** ondansetron (ZOFRAN) IV    Objective: Weight change: 6 oz (0.17 kg)  Intake/Output Summary (Last 24 hours) at 02/01/15 2130 Last data filed at 02/01/15 1900  Gross per 24 hour  Intake   2595 ml  Output      0 ml  Net   2595 ml   Blood pressure 96/50, pulse 74, temperature 98.7 F (37.1 C), temperature source Oral, resp. rate 16, weight 112 lb 2.4 oz (50.87 kg), last menstrual period 01/23/2015, SpO2 100 %. Temp:  [98.2 F (36.8 C)-103.2 F (39.6 C)] 98.7 F (37.1 C) (02/03 1719) Pulse Rate:  [74-124] 74 (02/03 1719) Resp:  [16] 16 (02/03 1719) BP: (96-117)/(50-74) 96/50 mmHg (02/03 1719) SpO2:  [98 %-100 %] 100 % (02/03 1719)  Physical Exam: General: Alert and awake, oriented x3, not in any acute distress. HEENT: anicteric sclera EOMI, oropharynx clear and without exudate CVS regular rate, normal r, no murmur rubs or gallops Chest: clear to auscultation bilaterally, no wheezing, rales or rhonchi Abdomen: soft nontender, nondistended, normal bowel sounds, Extremities: no clubbing or edema noted bilaterally Skin: no rashes. Right arm was slight hypopigmentation but this is chronic Neuro: nonfocal, strength and sensation intact    CBC:  CBC Latest Ref Rng 02/01/2015 01/31/2015 01/30/2015  WBC 4.0 - 10.5 K/uL 8.1 7.5 12.3(H)  Hemoglobin 12.0 - 15.0 g/dL 10.9(L) 10.5(L) 11.6(L)  Hematocrit 36.0 - 46.0 % 33.2(L) 32.1(L) 35.2(L)  Platelets 150 - 400 K/uL 211 186 195     BMET  Recent Labs  01/31/15 0550 02/01/15 0745  NA 140 140  K 3.4* 3.7  CL 109 108  CO2 24 27    GLUCOSE 89 97  BUN <5* <5*  CREATININE 0.61 0.55  CALCIUM 8.1* 8.8     Liver Panel   Recent Labs  01/30/15 0011 01/31/15 0550  PROT 7.2 5.7*  ALBUMIN 3.4* 2.9*  AST 36 23  ALT 41* 38*  ALKPHOS 86 79  BILITOT 0.8 0.4       Sedimentation Rate  Recent Labs  01/30/15 0518  ESRSEDRATE 50*   C-Reactive Protein  Recent Labs  01/30/15 0518  CRP 7.1*    Micro Results: Recent Results (from the past 720 hour(s))  Culture, blood (routine x 2)     Status: None (Preliminary result)   Collection Time: 01/30/15 12:14 AM  Result Value Ref Range Status   Specimen Description BLOOD LEFT ANTECUBITAL  Final   Special Requests BOTTLES DRAWN AEROBIC AND ANAEROBIC 10CC EACH  Final   Culture   Final           BLOOD CULTURE RECEIVED NO GROWTH TO DATE CULTURE WILL BE HELD FOR 5 DAYS BEFORE ISSUING A FINAL NEGATIVE REPORT Note: Culture results may be compromised due to an excessive volume of blood received in culture bottles. Performed at [facility]    Report Status PENDING  Incomplete  Culture, blood (routine x 2)     Status: None (Preliminary result)   Collection Time: 01/30/15 12:18 AM  Result Value Ref Range Status   Specimen Description BLOOD RIGHT ANTECUBITAL  Final   Special Requests BOTTLES DRAWN AEROBIC AND ANAEROBIC 5CC EACH  Final   Culture   Final           BLOOD CULTURE RECEIVED NO GROWTH TO DATE CULTURE WILL BE HELD FOR 5 DAYS BEFORE ISSUING A FINAL NEGATIVE REPORT Performed at [facility]    Report Status PENDING  Incomplete  Urine culture     Status: None (Preliminary result)   Collection Time: 01/30/15  1:10 AM  Result Value Ref Range Status   Specimen Description URINE, CLEAN CATCH  Final   Special Requests NONE  Final   Colony Count   Final    15,000 COLONIES/ML Performed at [facility]    Culture   Final    STAPHYLOCOCCUS SPECIES (COAGULASE NEGATIVE) Note: RIFAMPIN AND GENTAMICIN SHOULD NOT BE USED AS SINGLE DRUGS FOR  TREATMENT OF STAPH INFECTIONS. Performed at [facility]    Report Status PENDING  Incomplete  Malaria smear     Status: None   Collection Time: 01/30/15  6:36 AM  Result Value Ref Range Status   Specimen Description Blood  Final   Special Requests Normal  Final   Malaria Prep   Final    No Plasmodium or Other Blood Parasites Seen on Thick or Thin Smears For persons strongly suspected of having a blood parasite,but have negative smears, it is recommended that blood films be repeated approximately every 12 to 24 hours for 3 consecutive  days. Performed at [facility]    Report Status 01/31/2015 FINAL  Final  Respiratory virus panel (routine influenza)     Status: Abnormal   Collection Time: 01/30/15  1:37 PM  Result Value Ref Range Status   Respiratory Syncytial Virus A Positive (A) Negative Final   Respiratory Syncytial Virus B Negative Negative Final   Influenza A Negative Negative Final   Influenza B Negative Negative Final   Parainfluenza 1 Negative Negative Final   Parainfluenza 2 Negative Negative Final   Parainfluenza 3 Negative Negative Final   Metapneumovirus Negative Negative Final   Rhinovirus Negative Negative Final   Adenovirus Negative Negative Final    Comment: (NOTE) Performed At: [facility] [place], [place] [id number] Lindon Romp MD YF:[id number]   Malaria smear     Status: None   Collection Time: 01/30/15  1:54 PM  Result Value Ref Range Status   Specimen Description Blood  Final   Special Requests Normal  Final   Malaria Prep   Final    No Plasmodium or Other Blood Parasites Seen on Thick or Thin Smears For persons strongly suspected of having a blood parasite,but have negative smears, it is recommended that blood films be repeated approximately every 12 to 24 hours for 3 consecutive  days. Performed at [facility]    Report Status 02/01/2015 FINAL  Final    Studies/Results: Dg Chest 2  View  02/01/2015   CLINICAL DATA:  Fever.  Initial encounter.  EXAM: CHEST  2 VIEW  COMPARISON:  Radiographs and CT 01/30/2015.  FINDINGS: The heart size and mediastinal contours are stable with stable mild prominence of the right hilum. The lungs are clear. There is no pleural effusion or pneumothorax. The bones appear unchanged.  IMPRESSION: Stable chest. No evidence of pneumonia or other active cardiopulmonary process.   Electronically Signed   By: Camie Patience M.D.   On: 02/01/2015 13:36      Assessment/Plan:  Principal Problem:   SIRS (  systemic inflammatory response syndrome) Active Problems:   Fever   Thyroid cyst   Cough   FUO (fever of unknown origin)   Arterial hypotension   Transaminitis   Travel sickness   Pyrexia    Tanya Armstrong is a 49 y.o. female  with FUO after returning from [place].  #1 FUO in returning traveller: We  Had concerns for Malaria and therefore repeated her smear which was negative. Dengue possible though does not seem likely. Not good fit for Chikungunya either I doubt Japanese Encephalitis. She has serological evidence of past EBV infection not acute infection. She does not have hep A, b or C. HIV is negative. Her RSV is + from her respiratory panel. CT abd/pelvis, chest not revealing. Repeat CXR negative.     --while RSV could explain an acute fever I am perplexed by how long these fevers have apparently been going on   --I will intiate further Rheumatology workup but if not much more to suggest an ID cause beyond RSV would consider dc pt home for followup in our clinic  I will also touch base with my expert colleagues in travel medicine      LOS: 2 days   Alcide Evener 02/01/2015, 9:30 PM

## 2015-02-02 LAB — CBC WITH DIFFERENTIAL/PLATELET
BASOS ABS: 0 10*3/uL (ref 0.0–0.1)
BASOS PCT: 0 % (ref 0–1)
Eosinophils Absolute: 0.1 10*3/uL (ref 0.0–0.7)
Eosinophils Relative: 1 % (ref 0–5)
HEMATOCRIT: 30.2 % — AB (ref 36.0–46.0)
Hemoglobin: 9.9 g/dL — ABNORMAL LOW (ref 12.0–15.0)
LYMPHS ABS: 1.3 10*3/uL (ref 0.7–4.0)
Lymphocytes Relative: 13 % (ref 12–46)
MCH: 28.6 pg (ref 26.0–34.0)
MCHC: 32.8 g/dL (ref 30.0–36.0)
MCV: 87.3 fL (ref 78.0–100.0)
MONOS PCT: 10 % (ref 3–12)
Monocytes Absolute: 1 10*3/uL (ref 0.1–1.0)
NEUTROS ABS: 7.6 10*3/uL (ref 1.7–7.7)
NEUTROS PCT: 76 % (ref 43–77)
PLATELETS: 202 10*3/uL (ref 150–400)
RBC: 3.46 MIL/uL — ABNORMAL LOW (ref 3.87–5.11)
RDW: 12.8 % (ref 11.5–15.5)
WBC: 10 10*3/uL (ref 4.0–10.5)

## 2015-02-02 LAB — URINE CULTURE: Colony Count: 15000

## 2015-02-02 LAB — CK: Total CK: 27 U/L (ref 7–177)

## 2015-02-02 LAB — FERRITIN: FERRITIN: 42 ng/mL (ref 10–291)

## 2015-02-02 LAB — LACTATE DEHYDROGENASE: LDH: 113 U/L (ref 94–250)

## 2015-02-02 MED ORDER — ACETAMINOPHEN 325 MG PO TABS: 650.0000 mg | ORAL_TABLET | Freq: Four times a day (QID) | ORAL | Status: DC | PRN

## 2015-02-02 NOTE — Care Management Note (Addendum)
CARE MANAGEMENT NOTE 02/02/2015  Patient:  Tanya Armstrong, Tanya Armstrong   Account Number:  [id number]  Date Initiated:  01/31/2015  Documentation initiated by:  Anya Murphey  Subjective/Objective Assessment:   CM following for progression and d/c planning.     Action/Plan:   No HH needs identified. Attempting to arrange followup appointment at ID clinic  02/02/2015 1059 Contacted ID clinic re followup appointment, received return call from clinic and their practice is to setup the appointment and contact the pt from the clinic. They will contact ID consult re how soon he would like to see the pt.  Anticipated DC Date:  02/02/2015   Anticipated DC Plan:  HOME/SELF CARE         Choice offered to / List presented to:             Status of service:  Completed, signed off Medicare Important Message given?  NO (If response is "NO", the following Medicare IM given date fields will be blank) Date Medicare IM given:   Medicare IM given by:   Date Additional Medicare IM given:   Additional Medicare IM given by:    Discharge Disposition:  HOME/SELF CARE  Per UR Regulation:    If discussed at Long Length of Stay Meetings, dates discussed:    Comments:

## 2015-02-02 NOTE — Progress Notes (Addendum)
Patient refused interpreter instead went over discharge instructions via telephone on speaker with patient and patient's daughter-in-law Haze Justin). All questions answered.

## 2015-02-02 NOTE — Discharge Summary (Addendum)
Physician Discharge Summary  Tanya Armstrong MRN: [id number] DOB/AGE: [birth year] 50 y.o.  PCP: No primary care provider on file.   Admit date: 01/30/2015 Discharge date: 02/02/2015  Discharge Diagnoses:      SIRS (systemic inflammatory response syndrome) Active Problems:   Fever   Thyroid cyst   Cough   FUO (fever of unknown origin)   Arterial hypotension   Transaminitis   Travel sickness   Pyrexia   Acute bronchiolitis due to respiratory syncytial virus (RSV)   Follow-up recommendations Follow-up with PCP in 5-7 days Follow-up with infectious disease as scheduled Repeat CBC, BMP in one week     Medication List    STOP taking these medications        azithromycin 250 MG tablet  Commonly known as:  ZITHROMAX     ibuprofen 200 MG tablet  Commonly known as:  ADVIL,MOTRIN      TAKE these medications        acetaminophen 325 MG tablet  Commonly known as:  TYLENOL  Take 2 tablets (650 mg total) by mouth every 6 (six) hours as needed for mild pain (or Fever >/= 101).        Discharge Condition: Stable Disposition: Final discharge disposition not confirmed   Consults: Infectious disease  Significant Diagnostic Studies: Dg Chest 2 View  02/01/2015   CLINICAL DATA:  Fever.  Initial encounter.  EXAM: CHEST  2 VIEW  COMPARISON:  Radiographs and CT 01/30/2015.  FINDINGS: The heart size and mediastinal contours are stable with stable mild prominence of the right hilum. The lungs are clear. There is no pleural effusion or pneumothorax. The bones appear unchanged.  IMPRESSION: Stable chest. No evidence of pneumonia or other active cardiopulmonary process.   Electronically Signed   By: Camie Patience M.D.   On: 02/01/2015 13:36   Ct Chest W Contrast  01/30/2015   CLINICAL DATA:  Fever of unknown origin.  EXAM: CT CHEST, ABDOMEN, AND PELVIS WITH CONTRAST  TECHNIQUE: Multidetector CT imaging of the chest, abdomen and pelvis was performed following the standard protocol  during bolus administration of intravenous contrast.  CONTRAST:  146mL OMNIPAQUE IOHEXOL 300 MG/ML  SOLN  COMPARISON:  Plain film of 01/30/2015 chest.  No prior CT.  FINDINGS: CT CHEST FINDINGS  Mediastinum/Nodes: Right greater than left thyroid nodules, as detailed on today's ultrasound. Right-sided thyroid enlargement displaces the trachea to the left. Heart size upper normal, accentuated by a mild pectus excavatum deformity. No pericardial effusion. No mediastinal or hilar adenopathy.  Lungs/Pleura: Mild motion degradation throughout. 4 mm right middle lobe lung nodule on image 27.  2 mm left lower lobe lung nodule on image 30. No lobar consolidation.  Trace bilateral pleural fluid, possibly physiologic.  Musculoskeletal: No acute osseous abnormality.  CT ABDOMEN PELVIS FINDINGS  Hepatobiliary: Normal liver. Normal gallbladder, without biliary ductal dilatation.  Pancreas: Normal, without mass or ductal dilatation.  Spleen: Normal  Adrenals/Urinary Tract: Normal adrenal glands. Normal left kidney. Heterogeneous enhancement involves the right kidney, including on images 52 through 68 of series 201. No hydronephrosis. Normal urinary bladder.  Stomach/Bowel: Normal stomach, without wall thickening. Normal colon, appendix, and terminal ileum. Normal small bowel.  Vascular/Lymphatic: Normal caliber of the aorta and branch vessels. No abdominopelvic adenopathy.  Reproductive: Uterine fundal mass measures 7.3 x 7.8 cm on sagittal image 67. No adnexal mass.  Other: Trace free pelvic fluid is likely physiologic.  Musculoskeletal: No acute osseous abnormality.  IMPRESSION: 1. Right-sided heterogeneous renal enhancement, most  consistent with pyelonephritis. No other explanation for fever. 2. Mild motion degradation throughout. 3. Dominant uterine fundal fibroid. 4. Trace bilateral pleural fluid, possibly physiologic. 5. Pulmonary nodules up to 4 mm. If the patient is at high risk for bronchogenic carcinoma, follow-up chest  CT at 1 year is recommended. If the patient is at low risk, no follow-up is needed. This recommendation follows the consensus statement: "Guidelines for Management of Small Pulmonary Nodules Detected on CT Scans: A Statement from the [employer] Society" as published in Radiology 2005; 237:395-400. Available online at: DietDisorder.cz.   Electronically Signed   By: Jeronimo Greaves M.D.   On: 01/30/2015 19:09   US Soft Tissue Head/neck  01/30/2015   CLINICAL DATA:  Thyroid cyst  EXAM: THYROID ULTRASOUND  TECHNIQUE: Ultrasound examination of the thyroid gland and adjacent soft tissues was performed.  COMPARISON:  01/26/2008  FINDINGS: Right thyroid lobe  Measurements: 6.5 x 3.2 x 3.6 cm. There are complex, primarily cystic nodules throughout the right lobe.  In the upper pole, the largest multi cystic nodule measures 2.7 x 1.3 x 1.7 cm. In the right lobe this is the nodule with the most solid component and is in the upper pole, and appears similar to 2009 when accounting for measurement error.  In the right lower pole there is a cluster of 3 cystic nodules with septations and solid areas, 1.4, 2.1, and 3.1 cm in maximal diameter. Some of the cysts contain echogenic foci with ring down artifact consistent with colloid.  Left thyroid lobe  Measurements: 5.5 x 1.3 x 1.6 cm. There are 2 measured complex cystic lesions, 2.1 x 1 x 1 cm in the upper pole and 8 x 5 x 7 mm in the lower pole. The upper pole lesion appears mildly larger compared to 2009, now 2.1 cm, previously 1.3 cm. This solid and cystic nodule appears to have been biopsied in 2009.  Isthmus  Thickness: 5 mm. There is a cystic 18 mm lesion in the right isthmus with colloid type internal echoes.  Lymphadenopathy  None visualized.  IMPRESSION: Numerous bilateral thyroid nodules, primarily cystic. Nodule size has varied since 2009, but there is no dominant growing nodule.   Electronically Signed   By: Tiburcio Pea M.D.    On: 01/30/2015 07:49   Ct Abdomen Pelvis W Contrast  01/30/2015   CLINICAL DATA:  Fever of unknown origin.  EXAM: CT CHEST, ABDOMEN, AND PELVIS WITH CONTRAST  TECHNIQUE: Multidetector CT imaging of the chest, abdomen and pelvis was performed following the standard protocol during bolus administration of intravenous contrast.  CONTRAST:  OMNIPAQUE IOHEXOL 300 MG/ML  SOLN  COMPARISON:  Plain film of 01/30/2015 chest.  No prior CT.  FINDINGS: CT CHEST FINDINGS  Mediastinum/Nodes: Right greater than left thyroid nodules, as detailed on today's ultrasound. Right-sided thyroid enlargement displaces the trachea to the left. Heart size upper normal, accentuated by a mild pectus excavatum deformity. No pericardial effusion. No mediastinal or hilar adenopathy.  Lungs/Pleura: Mild motion degradation throughout. 4 mm right middle lobe lung nodule on image 27.  2 mm left lower lobe lung nodule on image 30. No lobar consolidation.  Trace bilateral pleural fluid, possibly physiologic.  Musculoskeletal: No acute osseous abnormality.  CT ABDOMEN PELVIS FINDINGS  Hepatobiliary: Normal liver. Normal gallbladder, without biliary ductal dilatation.  Pancreas: Normal, without mass or ductal dilatation.  Spleen: Normal  Adrenals/Urinary Tract: Normal adrenal glands. Normal left kidney. Heterogeneous enhancement involves the right kidney, including on images 52 through 68 of  series 201. No hydronephrosis. Normal urinary bladder.  Stomach/Bowel: Normal stomach, without wall thickening. Normal colon, appendix, and terminal ileum. Normal small bowel.  Vascular/Lymphatic: Normal caliber of the aorta and branch vessels. No abdominopelvic adenopathy.  Reproductive: Uterine fundal mass measures 7.3 x 7.8 cm on sagittal image 67. No adnexal mass.  Other: Trace free pelvic fluid is likely physiologic.  Musculoskeletal: No acute osseous abnormality.  IMPRESSION: 1. Right-sided heterogeneous renal enhancement, most consistent with  pyelonephritis. No other explanation for fever. 2. Mild motion degradation throughout. 3. Dominant uterine fundal fibroid. 4. Trace bilateral pleural fluid, possibly physiologic. 5. Pulmonary nodules up to 4 mm. If the patient is at high risk for bronchogenic carcinoma, follow-up chest CT at 1 year is recommended. If the patient is at low risk, no follow-up is needed. This recommendation follows the consensus statement: "Guidelines for Management of Small Pulmonary Nodules Detected on CT Scans: A Statement from the [employer]" as published in Radiology 2005; 237:395-400. Available online at: https://www.arnold.com/.   Electronically Signed   By: Abigail Miyamoto M.D.   On: 01/30/2015 19:09   Dg Chest Port 1 View  01/30/2015   CLINICAL DATA:  Fevers, and chills, tremor for 2-3 days, diaphoresis. Recent trip to the head knob. The on antibiotics.  EXAM: PORTABLE CHEST - 1 VIEW  COMPARISON:  None.  FINDINGS: Cardiomediastinal silhouette is unremarkable. Strandy densities in the lung bases. No pleural effusion. No pneumothorax. Trachea is displaced to the LEFT at the thoracic inlet. Soft tissue planes and included osseous structures are nonacute  IMPRESSION: RIGHT lung base probable atelectasis.  Trachea displaced to the LEFT, infers thyroid mass, status post thyroid biopsy February 23, 2008.   Electronically Signed   By: Elon Alas   On: 01/30/2015 01:39      Microbiology: Recent Results (from the past 240 hour(s))  Culture, blood (routine x 2)     Status: None (Preliminary result)   Collection Time: 01/30/15 12:14 AM  Result Value Ref Range Status   Specimen Description BLOOD LEFT ANTECUBITAL  Final   Special Requests BOTTLES DRAWN AEROBIC AND ANAEROBIC 10CC EACH  Final   Culture   Final           BLOOD CULTURE RECEIVED NO GROWTH TO DATE CULTURE WILL BE HELD FOR 5 DAYS BEFORE ISSUING A FINAL NEGATIVE REPORT Note: Culture results may be compromised due to an  excessive volume of blood received in culture bottles. Performed at [facility]    Report Status PENDING  Incomplete  Culture, blood (routine x 2)     Status: None (Preliminary result)   Collection Time: 01/30/15 12:18 AM  Result Value Ref Range Status   Specimen Description BLOOD RIGHT ANTECUBITAL  Final   Special Requests BOTTLES DRAWN AEROBIC AND ANAEROBIC 5CC EACH  Final   Culture   Final           BLOOD CULTURE RECEIVED NO GROWTH TO DATE CULTURE WILL BE HELD FOR 5 DAYS BEFORE ISSUING A FINAL NEGATIVE REPORT Performed at [facility]    Report Status PENDING  Incomplete  Urine culture     Status: None   Collection Time: 01/30/15  1:10 AM  Result Value Ref Range Status   Specimen Description URINE, CLEAN CATCH  Final   Special Requests NONE  Final   Colony Count   Final    15,000 COLONIES/ML Performed at [facility]    Culture   Final    STAPHYLOCOCCUS SPECIES (COAGULASE NEGATIVE)  Note: RIFAMPIN AND GENTAMICIN SHOULD NOT BE USED AS SINGLE DRUGS FOR TREATMENT OF STAPH INFECTIONS. Performed at [facility]    Report Status 02/02/2015 FINAL  Final   Organism ID, Bacteria STAPHYLOCOCCUS SPECIES (COAGULASE NEGATIVE)  Final      Susceptibility   Staphylococcus species (coagulase negative) - MIC*    GENTAMICIN <=0.5 SENSITIVE Sensitive     LEVOFLOXACIN 0.25 SENSITIVE Sensitive     NITROFURANTOIN <=16 SENSITIVE Sensitive     OXACILLIN <=0.25 SENSITIVE Sensitive     PENICILLIN >=0.5 RESISTANT Resistant     RIFAMPIN <=0.5 SENSITIVE Sensitive     TRIMETH/SULFA <=10 SENSITIVE Sensitive     VANCOMYCIN 2 SENSITIVE Sensitive     TETRACYCLINE <=1 SENSITIVE Sensitive     * STAPHYLOCOCCUS SPECIES (COAGULASE NEGATIVE)  Malaria smear     Status: None   Collection Time: 01/30/15  6:36 AM  Result Value Ref Range Status   Specimen Description Blood  Final   Special Requests Normal  Final   Malaria Prep   Final    No Plasmodium or Other Blood Parasites  Seen on Thick or Thin Smears For persons strongly suspected of having a blood parasite,but have negative smears, it is recommended that blood films be repeated approximately every 12 to 24 hours for 3 consecutive  days. Performed at [facility]    Report Status 01/31/2015 FINAL  Final  Respiratory virus panel (routine influenza)     Status: Abnormal   Collection Time: 01/30/15  1:37 PM  Result Value Ref Range Status   Respiratory Syncytial Virus A Positive (A) Negative Final   Respiratory Syncytial Virus B Negative Negative Final   Influenza A Negative Negative Final   Influenza B Negative Negative Final   Parainfluenza 1 Negative Negative Final   Parainfluenza 2 Negative Negative Final   Parainfluenza 3 Negative Negative Final   Metapneumovirus Negative Negative Final   Rhinovirus Negative Negative Final   Adenovirus Negative Negative Final    Comment: (NOTE) Performed At: [facility] [place], [place] [id number] Lindon Romp MD WV:[id number]   Malaria smear     Status: None   Collection Time: 01/30/15  1:54 PM  Result Value Ref Range Status   Specimen Description Blood  Final   Special Requests Normal  Final   Malaria Prep   Final    No Plasmodium or Other Blood Parasites Seen on Thick or Thin Smears For persons strongly suspected of having a blood parasite,but have negative smears, it is recommended that blood films be repeated approximately every 12 to 24 hours for 3 consecutive  days. Performed at [facility]    Report Status 02/01/2015 FINAL  Final     Labs: Results for orders placed or performed during the hospital encounter of 01/30/15 (from the past 48 hour(s))  CBC     Status: Abnormal   Collection Time: 02/01/15  7:45 AM  Result Value Ref Range   WBC 8.1 4.0 - 10.5 K/uL   RBC 3.77 (L) 3.87 - 5.11 MIL/uL   Hemoglobin 10.9 (L) 12.0 - 15.0 g/dL   HCT 33.2 (L) 36.0 - 46.0 %   MCV 88.1 78.0 - 100.0 fL   MCH 28.9 26.0 -  34.0 pg   MCHC 32.8 30.0 - 36.0 g/dL   RDW 12.7 11.5 - 15.5 %   Platelets 211 150 - 400 K/uL  Basic metabolic panel     Status: Abnormal   Collection Time: 02/01/15  7:45  AM  Result Value Ref Range   Sodium 140 135 - 145 mmol/L   Potassium 3.7 3.5 - 5.1 mmol/L   Chloride 108 96 - 112 mmol/L   CO2 27 19 - 32 mmol/L   Glucose, Bld 97 70 - 99 mg/dL   BUN <5 (L) 6 - 23 mg/dL   Creatinine, Ser 0.55 0.50 - 1.10 mg/dL   Calcium 8.8 8.4 - 10.5 mg/dL   GFR calc non Af Amer >90 >90 mL/min   GFR calc Af Amer >90 >90 mL/min    Comment: (NOTE) The eGFR has been calculated using the CKD EPI equation. This calculation has not been validated in all clinical situations. eGFR's persistently <90 mL/min signify possible Chronic Kidney Disease.    Anion gap 5 5 - 15  CBC with Differential/Platelet     Status: Abnormal   Collection Time: 02/02/15  7:14 AM  Result Value Ref Range   WBC 10.0 4.0 - 10.5 K/uL   RBC 3.46 (L) 3.87 - 5.11 MIL/uL   Hemoglobin 9.9 (L) 12.0 - 15.0 g/dL   HCT 30.2 (L) 36.0 - 46.0 %   MCV 87.3 78.0 - 100.0 fL   MCH 28.6 26.0 - 34.0 pg   MCHC 32.8 30.0 - 36.0 g/dL   RDW 12.8 11.5 - 15.5 %   Platelets 202 150 - 400 K/uL   Neutrophils Relative % 76 43 - 77 %   Neutro Abs 7.6 1.7 - 7.7 K/uL   Lymphocytes Relative 13 12 - 46 %   Lymphs Abs 1.3 0.7 - 4.0 K/uL   Monocytes Relative 10 3 - 12 %   Monocytes Absolute 1.0 0.1 - 1.0 K/uL   Eosinophils Relative 1 0 - 5 %   Eosinophils Absolute 0.1 0.0 - 0.7 K/uL   Basophils Relative 0 0 - 1 %   Basophils Absolute 0.0 0.0 - 0.1 K/uL  CK     Status: None   Collection Time: 02/02/15  7:14 AM  Result Value Ref Range   Total CK 27 7 - 177 U/L  Lactate dehydrogenase     Status: None   Collection Time: 02/02/15  7:14 AM  Result Value Ref Range   LDH 113 94 - 250 U/L     HPI Tanya Armstrong is an 49 y.o. female with past mental history significant for thyroid nodule that was biopsied in the past had been visiting [place] before  Christmas and recently returned around January 21. In [place] she been staying in her oral area near multiple [employer]. She been visiting because her father just passed away and she was attending his funeral. She states that she did sleep outside quite a bit and did not detect herself against mosquitoes and was bitten on numerous occasions by mosquitoes 1 [place]. She is feeling relatively well until she had come back from [place] and developed fevers right years and chills. These had lasted for a few days and then dissipated. He then returned and she was seen in the emergency department on the 22nd. She had a cough as well as part of this which was nonproductive. She was seen on 01/30/15 and given a protrusion for azithromycin and received one dose, bu then returned later that day with persistent fevers and chills and admitted to the hospitalist service. A smear was taken for malaria which has been negative on thin smear. Fixed varus pending. Chest x-ray does not show any clear-cut evidence of pneumonia she had an ultrasound of her neck  to reassess her thyroid nodules are new new findings here either. Her admission labs are pertinent for leukocytosis, and slightly elevated AST. Her sedimentation rate is also elevated at 50.  She does not recall any history of latent tuberculosis or exposure to tuberculosis. She did walk barefoot some in her relative's house and in the area around she did not bathe or entering into any eyes of water to lake or stream. No other people have been L around her including her children who live here in [place]:  Fever with SIRS (systemic inflammatory response syndrome) Another fever spike 2/3 CT scan of the chest, abdomen, pelvis does not show any obvious infectious source. Abnormal appearing kidney was noted. However, patient is not symptomatic.  We Had concerns for Malaria and therefore repeated her smear which was negative. Dengue possible though does  not seem likely. Not good fit for Chikungunya either I doubt Japanese Encephalitis. She has serological evidence of past EBV infection not acute infection Influenza PCR negative Resp panel PCR RSV positive  hep A, b or C. HIV negative  Chest x-ray did not show any clear-cut infiltrate or other acute process. 2 observed off of antibiotics currently as per infectious disease recommendation.  Patient will need further rheumatologic workup for persistent intermittent fever, to be arranged for by PCP She will need to follow-up with infectious disease in 2-3 weeks   Multiple Thyroid nodules with tracheal deviation Noted on ultrasound 2/1 . TSH was low but FT4 is normal. This can be further pursued as an outpatient. She did undergo a biopsy in 2009 which showed cystic material.  Incidental uterine fibroid Outpatient follow-up. Asymptomatic  Incidental pulmonary nodules on CT Pulmonary nodules up to 4 mm, PCP to note CT scan report above and follow-up as needed   Hypokalemia Repleted     Discharge Exam:   Blood pressure 99/62, pulse 81, temperature 98.1 F (36.7 C), temperature source Oral, resp. rate 18, weight 50 kg (110 lb 3.7 oz), last menstrual period 01/23/2015, SpO2 98 %.  General appearance: alert, cooperative, appears stated age and no distress Resp: Decreased breath sounds at the bases but no definite crackles or wheezing. Cardio: regular rate and rhythm, S1, S2 normal, no murmur, click, rub or gallop GI: soft, non-tender; bowel sounds normal; no masses, no organomegaly Skin: Skin color, texture, turgor normal. No rashes or lesions Neurologic: Alert and oriented X 3, normal strength and tone. Normal symmetric reflexes. Normal coordination and gait       Discharge Instructions    Diet - low sodium heart healthy    Complete by:  As directed      Increase activity slowly    Complete by:  As directed            Follow-up Information    Please follow up.   Why:   [facility], they will call pt at home with an appointment time.      Follow up with PCP. Schedule an appointment as soon as possible for a visit in 1 week.   Why:  Post hospital follow-up      Follow up with Alcide Evener, MD. Schedule an appointment as soon as possible for a visit in 2 weeks.   Specialty:  Infectious Diseases   Why:  Post hospital follow-up   Contact information:   [address] [place] [address] [phone]       Signed: Reyne Dumas  02/02/2015, 12:13 PM

## 2015-02-03 LAB — ANGIOTENSIN CONVERTING ENZYME: Angiotensin-Converting Enzyme: 22 U/L (ref 8–52)

## 2015-02-03 LAB — RHEUMATOID FACTOR: Rhuematoid fact SerPl-aCnc: 11.9 IU/mL (ref 0.0–13.9)

## 2015-02-03 LAB — RPR: RPR Ser Ql: NONREACTIVE

## 2015-02-03 LAB — MPO/PR-3 (ANCA) ANTIBODIES
ANCA Proteinase 3: <3.5 U/mL (ref 0.0–3.5)
Myeloperoxidase Abs: <9 U/mL (ref 0.0–9.0)

## 2015-02-03 LAB — ANA: ANA: NEGATIVE

## 2015-02-05 LAB — CULTURE, BLOOD (ROUTINE X 2)
Culture: NO GROWTH
Culture: NO GROWTH

## 2015-02-06 LAB — PROTEIN ELECTROPHORESIS, SERUM
ALBUMIN ELP: 50.6 % — AB (ref 55.8–66.1)
Alpha-1-Globulin: 6.7 % — ABNORMAL HIGH (ref 2.9–4.9)
Alpha-2-Globulin: 13.2 % — ABNORMAL HIGH (ref 7.1–11.8)
BETA 2: 6 % (ref 3.2–6.5)
BETA GLOBULIN: 7.1 % (ref 4.7–7.2)
GAMMA GLOBULIN: 16.4 % (ref 11.1–18.8)
M-Spike, %: NOT DETECTED g/dL
Total Protein ELP: 5.7 g/dL — ABNORMAL LOW (ref 6.0–8.3)

## 2015-02-07 LAB — CRYOGLOBULIN

## 2015-02-14 ENCOUNTER — Encounter: Payer: Self-pay | Admitting: Infectious Diseases

## 2015-02-14 ENCOUNTER — Ambulatory Visit (INDEPENDENT_AMBULATORY_CARE_PROVIDER_SITE_OTHER): Payer: BLUE CROSS/BLUE SHIELD | Admitting: Infectious Diseases

## 2015-02-14 VITALS — BP 124/75 | HR 112 | Temp 98.2°F | Ht 62.0 in | Wt 108.0 lb

## 2015-02-14 DIAGNOSIS — R509 Fever, unspecified: Secondary | ICD-10-CM | POA: Diagnosis not present

## 2015-02-14 DIAGNOSIS — E042 Nontoxic multinodular goiter: Secondary | ICD-10-CM

## 2015-02-14 LAB — COMPREHENSIVE METABOLIC PANEL
ALT: 59 U/L — ABNORMAL HIGH (ref 0–35)
AST: 36 U/L (ref 0–37)
Albumin: 4 g/dL (ref 3.5–5.2)
Alkaline Phosphatase: 89 U/L (ref 39–117)
BUN: 14 mg/dL (ref 6–23)
CALCIUM: 9.3 mg/dL (ref 8.4–10.5)
CO2: 27 meq/L (ref 19–32)
CREATININE: 0.63 mg/dL (ref 0.50–1.10)
Chloride: 103 mEq/L (ref 96–112)
Glucose, Bld: 100 mg/dL — ABNORMAL HIGH (ref 70–99)
Potassium: 4 mEq/L (ref 3.5–5.3)
Sodium: 138 mEq/L (ref 135–145)
Total Bilirubin: 0.4 mg/dL (ref 0.2–1.2)
Total Protein: 7.4 g/dL (ref 6.0–8.3)

## 2015-02-14 LAB — CBC WITH DIFFERENTIAL/PLATELET
BASOS ABS: 0 10*3/uL (ref 0.0–0.1)
BASOS PCT: 0 % (ref 0–1)
Eosinophils Absolute: 0.1 10*3/uL (ref 0.0–0.7)
Eosinophils Relative: 1 % (ref 0–5)
HCT: 37.6 % (ref 36.0–46.0)
Hemoglobin: 12.3 g/dL (ref 12.0–15.0)
LYMPHS PCT: 25 % (ref 12–46)
Lymphs Abs: 2.5 10*3/uL (ref 0.7–4.0)
MCH: 29 pg (ref 26.0–34.0)
MCHC: 32.7 g/dL (ref 30.0–36.0)
MCV: 88.7 fL (ref 78.0–100.0)
MPV: 9.5 fL (ref 8.6–12.4)
Monocytes Absolute: 0.8 10*3/uL (ref 0.1–1.0)
Monocytes Relative: 8 % (ref 3–12)
NEUTROS PCT: 66 % (ref 43–77)
Neutro Abs: 6.6 10*3/uL (ref 1.7–7.7)
PLATELETS: 365 10*3/uL (ref 150–400)
RBC: 4.24 MIL/uL (ref 3.87–5.11)
RDW: 13.7 % (ref 11.5–15.5)
WBC: 10 10*3/uL (ref 4.0–10.5)

## 2015-02-14 LAB — TSH: TSH: 0.262 u[IU]/mL — AB (ref 0.350–4.500)

## 2015-02-14 NOTE — Addendum Note (Signed)
Addended by: Mardella Nuckles C on: 02/14/2015 11:13 AM   Modules accepted: Orders

## 2015-02-14 NOTE — Assessment & Plan Note (Addendum)
She appears to still have some sx. Will repeat her BCx, BCx for AFB, send fungal serologies, send repeat malaria smears. Seems unlikely that this was RSV, possible though.... Will have her back in 6 weeks, if still sx will repeat CT scans.

## 2015-02-14 NOTE — Progress Notes (Signed)
   Subjective:    Patient ID: Tanya Armstrong, female    DOB: [birth year], 49 y.o.   MRN: [id number]  HPI 49 y.o. F with hx of thyroid nodule that was biopsied in the past had been visiting [place] December 31 to January 21. [place] She been visiting because for her father's funeral. She states that she did sleep outside quite a bit and had multiple bites. She wass feeling relatively well until she came back and developed fevers right years and chills. These had lasted for a few days and then dissipated. He then returned and she was seen in the emergency department on the 22nd. She had a cough as well as part of this which was nonproductive. She was seen on 01/30/15 and given azithromycin and received one dose, bu then returned later that day with persistent fevers and chills and admitted to the hospitalist service. Two smears for malaria were negative.  Her RSV was +.  She had CT chest/abd showing pyelo and pulmonary nodules.  She had a thyroid u/s: Numerous bilateral thyroid nodules, primarily cystic. Nodule size has varied since 2009, but there is no dominant growing nodule. BCx (-), UCx 15k CNS.  She was d/c home on 02-02-15.  No further fever (has had not take with thermometer). Since d/c home. Has felt hot at night though, night sweats. Has had mild cough. No dysuria, no diarrhea.   On no medicaitions. Has yearly f/u with endo for her thyroid. Has not been seen yet.    Review of Systems  Constitutional: Negative for fever, chills and unexpected weight change.  Respiratory: Positive for cough and shortness of breath.   Gastrointestinal: Negative for diarrhea and constipation.  Genitourinary: Negative for difficulty urinating.  Hematological: Negative for adenopathy.       Objective:   Physical Exam  Constitutional: She appears well-developed and well-nourished.  HENT:  Mouth/Throat: No oropharyngeal exudate.  Eyes: EOM are normal. Pupils are  equal, round, and reactive to light. No scleral icterus.  Neck: Neck supple. Thyromegaly present.  Cardiovascular: Normal rate, regular rhythm and normal heart sounds.   Pulmonary/Chest: Effort normal and breath sounds normal.  Abdominal: Soft. Bowel sounds are normal. There is no tenderness.  Musculoskeletal: She exhibits no edema or tenderness.  Lymphadenopathy:    She has no cervical adenopathy.    She has no axillary adenopathy.          Assessment & Plan:

## 2015-02-14 NOTE — Assessment & Plan Note (Signed)
Will repeat her TSH.  Will have her seen by endo.

## 2015-02-14 NOTE — Progress Notes (Signed)
Patient ID: Tanya Armstrong, female   DOB: 04/11/1966, 49 y.o.   MRN: [id number] HPI: Tanya Armstrong is a 49 y.o. female to be eval for FUO.  Allergies: No Known Allergies  Vitals:    Past Medical History: Past Medical History  Diagnosis Date  . FUO (fever of unknown origin)   . Multiple thyroid nodules     Social History: History   Social History  . Marital Status: Married    Spouse Name: N/A  . Number of Children: N/A  . Years of Education: N/A   Social History Main Topics  . Smoking status: Never Smoker   . Smokeless tobacco: Never Used  . Alcohol Use: No  . Drug Use: No  . Sexual Activity: No   Other Topics Concern  . None   Social History Narrative    Previous Regimen:   Current Regimen:   Labs: HEPATITIS B SURFACE AG (no units)  Date Value  01/30/2015 NEGATIVE   HCV AB (no units)  Date Value  01/30/2015 NEGATIVE    CrCl: Estimated Creatinine Clearance: 65.8 mL/min (by C-G formula based on Cr of 0.63).  Lipids: No results found for: CHOL, TRIG, HDL, CHOLHDL, VLDL, LDLCALC  Assessment: 49 yo who is here for eval of her FUO. She recently travel to [place] for the death of her dad. According to her, the areas that she traveled to is mostly city like. She stated that she has some sweating on her back until the day prior til yesterday. I was there to interpret for Dr. Johnnye Sima. We are going to r/o several things. She also has some thyroid issues and we can't really confirm if she has seen an endocrinologist for this. She stated that Dr Lysle Rubens was the one dealing with this issue but she has not seen him in a while. We are going to refer her to Pecola Lawless upstairs for this and to see if the fever is related to possible hyperthyroidism. I took her upstairs to set up an appt for her. She is going to come back on the 25th for this.  Recommendations: Labs Appt with endocrinologist  Wilfred Lacy, PharmD [facility] 02/14/2015, 10:28 PM

## 2015-02-16 LAB — MALARIA SMEAR
RESULT - MALAR: NEGATIVE
Result - MALAR: NONE SEEN

## 2015-02-17 ENCOUNTER — Ambulatory Visit: Payer: BLUE CROSS/BLUE SHIELD | Admitting: Internal Medicine

## 2015-02-17 LAB — DENGUE FEVER ABS, IGG, IGM
DENGUE FEVER AB (IGM): 0.43
Dengue Fever Antibodies (IgG, IgM): 4.9 — ABNORMAL HIGH

## 2015-02-18 LAB — FUNGAL ANTIBODIES PANEL, ID-BLOOD
ASPERGILLUS FLAVUS ANTIBODIES: NEGATIVE
ASPERGILLUS FUMIGATUS: NEGATIVE
Aspergillus Niger Antibodies: NEGATIVE
BLASTOMYCES ABS, QN, DID: NEGATIVE
Coccidioides Antibody ID: NEGATIVE
Histoplasma Antibody, ID: NEGATIVE

## 2015-02-20 LAB — CULTURE, BLOOD (SINGLE)
Organism ID, Bacteria: NO GROWTH
Organism ID, Bacteria: NO GROWTH

## 2015-02-23 ENCOUNTER — Encounter: Payer: Self-pay | Admitting: Internal Medicine

## 2015-02-23 ENCOUNTER — Ambulatory Visit (INDEPENDENT_AMBULATORY_CARE_PROVIDER_SITE_OTHER): Payer: BLUE CROSS/BLUE SHIELD | Admitting: Internal Medicine

## 2015-02-23 VITALS — BP 110/70 | HR 110 | Temp 97.8°F | Resp 12 | Ht 62.0 in | Wt 108.0 lb

## 2015-02-23 DIAGNOSIS — E0590 Thyrotoxicosis, unspecified without thyrotoxic crisis or storm: Secondary | ICD-10-CM

## 2015-02-23 DIAGNOSIS — E042 Nontoxic multinodular goiter: Secondary | ICD-10-CM

## 2015-02-23 LAB — TSH: TSH: 0.53 u[IU]/mL (ref 0.35–4.50)

## 2015-02-23 LAB — T4, FREE: Free T4: 0.8 ng/dL (ref 0.60–1.60)

## 2015-02-23 LAB — T3, FREE: T3 FREE: 3.7 pg/mL (ref 2.3–4.2)

## 2015-02-23 NOTE — Progress Notes (Signed)
Patient ID: Tanya Armstrong, female   DOB: 02-26-1966, 49 y.o.   MRN: [id number]   HPI  Tanya Armstrong is a 49 y.o.-year-old female, referred by Dr. Johnnye Sima, in consultation for MNG and subclinical thyrotoxicosis (? If FUO can be 2/2 her thyrotoxicosis).  Thyroid U/S (01/30/2015): multiple cysts - unchanged since 2009, when she had FNA of 2 of the dominant cysts: the LML and the RLL >> benign.  Of note, she had a contrasted CT abd and chest on 01/30/2015 for investigation for FUO.  She tells me her fever resolved since last week.   I reviewed pt's thyroid tests: Lab Results  Component Value Date   TSH 0.262* 02/14/2015   TSH 0.331* 01/30/2015   FREET4 1.38 01/30/2015    Pt denies feeling nodules in neck,but she notices the thyroid being larger, no hoarseness, dysphagia/odynophagia, SOB with lying down.  Pt denies: - fatigue (had this, resolved now) - heat intolerance/cold intolerance (resolved) - tremors - palpitations - anxiety/depression - hyperdefecation/constipation - weight loss/gain - dry skin - hair loss - had nausea last week, now feels great  Pt does not have a FH of thyroid ds. No FH of thyroid cancer. No h/o radiation tx to head or neck.  No seaweed or kelp, + recent contrast studies (01/30/2015). No steroid use. No herbal supplements.   I reviewed her chart and she also has a history of HL, FUO (as mentioned above).  ROS: Constitutional: no weight gain/loss, no fatigue, no subjective hyperthermia/hypothermia Eyes: no blurry vision, no xerophthalmia ENT: no sore throat, no nodules palpated in throat, no dysphagia/odynophagia, no hoarseness Cardiovascular: no CP/SOB/palpitations/leg swelling Respiratory: no cough/SOB Gastrointestinal: no N/V/D/C Musculoskeletal: no muscle/joint aches Skin: no rashes Neurological: no tremors/numbness/tingling/dizziness Psychiatric: no depression/anxiety  Past Medical History  Diagnosis Date  . FUO (fever of unknown origin)    . Multiple thyroid nodules    No past surgical history on file.   History   Social History  . Marital Status: Married    Spouse Name: N/A  . Number of Children: 2   Occupational History  . n/a   Social History Main Topics  . Smoking status: Never Smoker   . Smokeless tobacco: Never Used  . Alcohol Use: No  . Drug Use: No   Current Outpatient Prescriptions on File Prior to Visit  Medication Sig Dispense Refill  . acetaminophen (TYLENOL) 325 MG tablet Take 2 tablets (650 mg total) by mouth every 6 (six) hours as needed for mild pain (or Fever >/= 101). (Patient not taking: Reported on 02/14/2015) 60 tablet 2   No current facility-administered medications on file prior to visit.   No Known Allergies   No family history on file.  PE: BP 110/70 mmHg  Pulse 110  Temp(Src) 97.8 F (36.6 C) (Oral)  Resp 12  Ht 5\' 2"  (1.575 m)  Wt 108 lb (48.988 kg)  BMI 19.75 kg/m2  SpO2 97%  LMP 01/23/2015 Wt Readings from Last 3 Encounters:  02/23/15 108 lb (48.988 kg)  02/14/15 108 lb (48.988 kg)  02/02/15 110 lb 3.7 oz (50 kg)   Constitutional: underweight, in NAD Eyes: PERRLA, EOMI, no exophthalmos ENT: moist mucous membranes, + protruding thyromegaly, no cervical lymphadenopathy Cardiovascular: tachycardia, RR, No MRG Respiratory: CTA B Gastrointestinal: abdomen soft, NT, ND, BS+ Musculoskeletal: no deformities, strength intact in all 4;  Skin: moist, warm, no rashes Neurological: very fine tremor with outstretched hands, DTR normal in all 4  ASSESSMENT: 1. MNG Thyroid U/S (01/30/2015): Right  thyroid lobe Measurements: 6.5 x 3.2 x 3.6 cm. There are complex, primarily  cystic nodules throughout the right lobe.  In the upper pole, the largest multi cystic nodule measures 2.7 x  1.3 x 1.7 cm. In the right lobe this is the nodule with the most  solid component and is in the upper pole, and appears similar to  2009 when accounting for measurement error.  In the right lower  pole there is a cluster of 3 cystic nodules with  septations and solid areas, 1.4, 2.1, and 3.1 cm in maximal  diameter. Some of the cysts contain echogenic foci with ring down  artifact consistent with colloid.   Left thyroid lobe Measurements: 5.5 x 1.3 x 1.6 cm. There are 2 measured complex  cystic lesions, 2.1 x 1 x 1 cm in the upper pole and 8 x 5 x 7 mm in  the lower pole. The upper pole lesion appears mildly larger compared  to 2009, now 2.1 cm, previously 1.3 cm. This solid and cystic nodule  appears to have been biopsied in 2009.   Isthmus Thickness: 5 mm. There is a cystic 18 mm lesion in the right isthmus  with colloid type internal echoes.   Lymphadenopathy None visualized.   PLAN: 1. MNG  - I reviewed the images of her thyroid ultrasound. The whole thyroid volume is replaced by cystic structures, not much changed from 2009, when 2 of these cysts were Bx'ed (benign). - since no neck compression sxs >> no further investigation needed  2. Subclinical hyperthyroidism - could have been related to her contrast substance that she received on 01/30/2015 or to an episode of thyroiditis - we will recheck the tests today to see if improving. If improving, will just need to follow them. If worsening, will likely need to start a beta blocker and to get an uptake and scan. We can only get the uptake and scan in ~ 1 mo to allow the iodine that she got with the CT scan to be depleted from her system. - we also discussed that this may be a case of toxic MNG or Graves, but I have lower suspicion for these - I will see her back in 4 months, but will stay in touch until then.  Office Visit on 02/23/2015  Component Date Value Ref Range Status  . TSH 02/23/2015 0.53  0.35 - 4.50 uIU/mL Final  . Free T4 02/23/2015 0.80  0.60 - 1.60 ng/dL Final  . T3, Free 02/23/2015 3.7  2.3 - 4.2 pg/mL Final   TFTs normalized. She likely experienced an episode of thyroiditis, which has now resolved. Her FUO  may have been related to this. I will repeat labs in  6 weeks.

## 2015-02-23 NOTE — Patient Instructions (Addendum)
Please stop at the lab.  We will let you know if we need to do additional studies or just follow the thyroid tests.  If the thyroid tests are abnormal, we may need to start Atenolol, a medicine that helps your pulse slow down a little.  Please return in 4 months.

## 2015-02-28 ENCOUNTER — Encounter: Payer: Self-pay | Admitting: *Deleted

## 2015-04-03 LAB — AFB CULTURE, BLOOD

## 2015-06-15 ENCOUNTER — Ambulatory Visit (INDEPENDENT_AMBULATORY_CARE_PROVIDER_SITE_OTHER): Payer: BLUE CROSS/BLUE SHIELD | Admitting: Family Medicine

## 2015-06-15 VITALS — BP 128/78 | HR 105 | Temp 98.2°F | Resp 17 | Ht 62.0 in | Wt 108.6 lb

## 2015-06-15 DIAGNOSIS — R059 Cough, unspecified: Secondary | ICD-10-CM

## 2015-06-15 DIAGNOSIS — J069 Acute upper respiratory infection, unspecified: Secondary | ICD-10-CM | POA: Diagnosis not present

## 2015-06-15 DIAGNOSIS — R05 Cough: Secondary | ICD-10-CM | POA: Diagnosis not present

## 2015-06-15 MED ORDER — PROMETHAZINE-CODEINE 6.25-10 MG/5ML PO SYRP: 5.0000 mL | ORAL_SOLUTION | Freq: Every evening | ORAL | Status: DC | PRN

## 2015-06-15 MED ORDER — BENZONATATE 100 MG PO CAPS: 100.0000 mg | ORAL_CAPSULE | Freq: Two times a day (BID) | ORAL | Status: DC | PRN

## 2015-06-15 NOTE — Patient Instructions (Signed)
Cough, Adult  A cough is a reflex that helps clear your throat and airways. It can help heal the body or may be a reaction to an irritated airway. A cough may only last 2 or 3 weeks (acute) or may last more than 8 weeks (chronic).  CAUSES Acute cough:  Viral or bacterial infections. Chronic cough:  Infections.  Allergies.  Asthma.  Post-nasal drip.  Smoking.  Heartburn or acid reflux.  Some medicines.  Chronic lung problems (COPD).  Cancer. SYMPTOMS   Cough.  Fever.  Chest pain.  Increased breathing rate.  High-pitched whistling sound when breathing (wheezing).  Colored mucus that you cough up (sputum). TREATMENT   A bacterial cough may be treated with antibiotic medicine.  A viral cough must run its course and will not respond to antibiotics.  Your caregiver may recommend other treatments if you have a chronic cough. HOME CARE INSTRUCTIONS   Only take over-the-counter or prescription medicines for pain, discomfort, or fever as directed by your caregiver. Use cough suppressants only as directed by your caregiver.  Use a cold steam vaporizer or humidifier in your bedroom or home to help loosen secretions.  Sleep in a semi-upright position if your cough is worse at night.  Rest as needed.  Stop smoking if you smoke. SEEK IMMEDIATE MEDICAL CARE IF:   You have pus in your sputum.  Your cough starts to worsen.  You cannot control your cough with suppressants and are losing sleep.  You begin coughing up blood.  You have difficulty breathing.  You develop pain which is getting worse or is uncontrolled with medicine.  You have a fever. MAKE SURE YOU:   Understand these instructions.  Will watch your condition.  Will get help right away if you are not doing well or get worse. Document Released: 06/14/2011 Document Revised: 03/09/2012 Document Reviewed: 06/14/2011 ExitCare Patient Information 2015 [employer]. This information is not intended  to replace advice given to you by your health care provider. Make sure you discuss any questions you have with your health care provider.  

## 2015-06-15 NOTE — Progress Notes (Signed)
   Subjective:    Patient ID: Tanya Armstrong, female    DOB: October 26, 1966, 49 y.o.   MRN: [id number]  HPI This is a pleasant 49 yo female who presents today with 2 week history of cough. Started with sore throat. Cough is dry, but she has coughed so much that she has vomited. Has tried otc cough medicine and nyquil with little relief.    Past Medical History  Diagnosis Date  . FUO (fever of unknown origin)   . Multiple thyroid nodules    History reviewed. No pertinent past surgical history. History reviewed. No pertinent family history. History  Substance Use Topics  . Smoking status: Never Smoker   . Smokeless tobacco: Never Used  . Alcohol Use: No   Medications, allergies, past medical history, surgical history, family history, social history and problem list reviewed and updated.   Review of Systems No fever, no smoking history, no headache, no ear pain,     Objective:   Physical Exam Physical Exam  Constitutional: Oriented to person, place, and time. She appears well-developed and well-nourished.  HENT:  Head: Normocephalic and atraumatic.  Eyes: Conjunctivae are normal.  Neck: Normal range of motion. Neck supple.  Cardiovascular: Normal rate, regular rhythm and normal heart sounds.   Pulmonary/Chest: Effort normal and breath sounds normal.  Musculoskeletal: Normal range of motion.  Neurological: Alert and oriented to person, place, and time.  Skin: Skin is warm and dry.  Psychiatric: Normal mood and affect. Behavior is normal. Judgment and thought content normal.  Vitals reviewed. BP 128/78 mmHg  Pulse 105  Temp(Src) 98.2 F (36.8 C) (Oral)  Resp 17  Ht 5\' 2"  (1.575 m)  Wt 108 lb 9.6 oz (49.261 kg)  BMI 19.86 kg/m2  SpO2 98%      Assessment & Plan:  1. Cough - promethazine-codeine (PHENERGAN WITH CODEINE) 6.25-10 MG/5ML syrup; Take 5 mLs by mouth at bedtime as needed for cough.  Dispense: 60 mL; Refill: 0 - benzonatate (TESSALON) 100 MG capsule; Take 1  capsule (100 mg total) by mouth 2 (two) times daily as needed for cough.  Dispense: 20 capsule; Refill: 0  2. Viral upper respiratory illness - discussed symptomatic treatment - RTC if fever greater than 101, worsening symptoms, SOB or wheeze occur  Clarene Reamer, FNP-BC  [facility]  06/15/2015 11:20 AM

## 2015-07-06 ENCOUNTER — Ambulatory Visit: Payer: BLUE CROSS/BLUE SHIELD | Admitting: Internal Medicine

## 2015-10-24 ENCOUNTER — Ambulatory Visit (INDEPENDENT_AMBULATORY_CARE_PROVIDER_SITE_OTHER): Payer: BLUE CROSS/BLUE SHIELD | Admitting: Internal Medicine

## 2015-10-24 ENCOUNTER — Encounter: Payer: Self-pay | Admitting: Internal Medicine

## 2015-10-24 VITALS — BP 122/68 | HR 94 | Temp 98.4°F | Resp 12 | Wt 110.0 lb

## 2015-10-24 DIAGNOSIS — E042 Nontoxic multinodular goiter: Secondary | ICD-10-CM | POA: Diagnosis not present

## 2015-10-24 DIAGNOSIS — E0590 Thyrotoxicosis, unspecified without thyrotoxic crisis or storm: Secondary | ICD-10-CM | POA: Insufficient documentation

## 2015-10-24 LAB — TSH: TSH: 0.44 u[IU]/mL (ref 0.35–4.50)

## 2015-10-24 LAB — T3, FREE: T3 FREE: 3.4 pg/mL (ref 2.3–4.2)

## 2015-10-24 LAB — T4, FREE: FREE T4: 0.81 ng/dL (ref 0.60–1.60)

## 2015-10-24 NOTE — Progress Notes (Addendum)
Patient ID: Tanya Armstrong, female   DOB: 11-12-1966, 49 y.o.   MRN: [id number]   HPI  Tanya Armstrong is a 49 y.o.-year-old female, initially referred by Dr. Johnnye Sima, in consultation for MNG and subclinical thyrotoxicosis (? If FUO can be 2/2 her thyrotoxicosis). Last visit 8 mo ago.   Thyroid U/S (01/30/2015): multiple cysts - unchanged since 2009, when she had FNA of 2 of the dominant cysts: the LML and the RLL >> benign.  Of note, she had a contrasted CT abd and chest on 01/30/2015 for investigation for FUO.  Her fever resolved before last visit.   I reviewed pt's thyroid tests: Lab Results  Component Value Date   TSH 0.53 02/23/2015   TSH 0.262* 02/14/2015   TSH 0.331* 01/30/2015   FREET4 0.80 02/23/2015   FREET4 1.38 01/30/2015    Pt has more pressure in neck, which is uncomfortable, and noticed the thyroid being larger. No hoarseness, dysphagia/odynophagia, SOB with lying down.  Pt denies: - fatigue (had this, resolved now) - heat intolerance/cold intolerance (resolved) - tremors - palpitations - anxiety/depression - hyperdefecation/constipation - weight loss/gain - dry skin - hair loss  She c/o: - shoulder pain and HAs  Pt does not have a FH of thyroid ds. No FH of thyroid cancer. No h/o radiation tx to head or neck.  No seaweed or kelp, no recent contrast studies (last 01/30/2015). No steroid use. No herbal supplements.   I reviewed her chart and she also has a history of HL, FUO (as mentioned above).  ROS: Constitutional: no weight gain/loss, no fatigue, no subjective hyperthermia/hypothermia Eyes: no blurry vision, no xerophthalmia ENT: no sore throat, + see HPU Cardiovascular: no CP/SOB/palpitations/leg swelling Respiratory: no cough/SOB Gastrointestinal: no N/V/D/C Musculoskeletal: no muscle/joint aches Skin: no rashes Neurological: no tremors/numbness/tingling/dizziness Psychiatric: no depression/anxiety  Past Medical History  Diagnosis Date  .  FUO (fever of unknown origin)   . Multiple thyroid nodules    No past surgical history on file.   History   Social History  . Marital Status: Married    Spouse Name: N/A  . Number of Children: 2   Occupational History  . n/a   Social History Main Topics  . Smoking status: Never Smoker   . Smokeless tobacco: Never Used  . Alcohol Use: No  . Drug Use: No   Current Outpatient Prescriptions on File Prior to Visit  Medication Sig Dispense Refill  . acetaminophen (TYLENOL) 325 MG tablet Take 2 tablets (650 mg total) by mouth every 6 (six) hours as needed for mild pain (or Fever >/= 101). (Patient not taking: Reported on 02/14/2015) 60 tablet 2  . benzonatate (TESSALON) 100 MG capsule Take 1 capsule (100 mg total) by mouth 2 (two) times daily as needed for cough. 20 capsule 0  . Multiple Vitamin (MULTIVITAMIN) tablet Take 1 tablet by mouth daily.    . promethazine-codeine (PHENERGAN WITH CODEINE) 6.25-10 MG/5ML syrup Take 5 mLs by mouth at bedtime as needed for cough. 60 mL 0   No current facility-administered medications on file prior to visit.   No Known Allergies   No family history on file.  PE: BP 122/68 mmHg  Pulse 94  Temp(Src) 98.4 F (36.9 C) (Oral)  Resp 12  Wt 110 lb (49.896 kg)  SpO2 97% Body mass index is 20.11 kg/(m^2). Wt Readings from Last 3 Encounters:  10/24/15 110 lb (49.896 kg)  06/15/15 108 lb 9.6 oz (49.261 kg)  02/23/15 108 lb (48.988 kg)  Constitutional: underweight, in NAD Eyes: PERRLA, EOMI, no exophthalmos ENT: moist mucous membranes, + protruding thyromegaly - R lobe and isthmus, no cervical lymphadenopathy Cardiovascular: tachycardia, RR, No MRG Respiratory: CTA B Gastrointestinal: abdomen soft, NT, ND, BS+ Musculoskeletal: no deformities, strength intact in all 4;  Skin: moist, warm, no rashes Neurological: very fine tremor with outstretched hands, DTR normal in all 4  ASSESSMENT: 1. MNG Thyroid U/S (01/30/2015):  Right thyroid lobe  Measurements: 6.5 x 3.2 x 3.6 cm. There are complex, primarily cystic nodules throughout the right lobe.  - In the upper pole, the largest multi cystic nodule measures 2.7 x 1.3 x 1.7 cm. In the right lobe this is the nodule with the most solid component and is in the upper pole, and appears similar to 2009 when accounting for measurement error.  - In the right lower pole there is a cluster of 3 cystic nodules with septations and solid areas, 1.4, 2.1, and 3.1 cm in maximal diameter. Some of the cysts contain echogenic foci with ring down artifact consistent with colloid.    Left thyroid lobe Measurements: 5.5 x 1.3 x 1.6 cm.  - There are 2 measured complex  cystic lesions, 2.1 x 1 x 1 cm in the upper pole and 8 x 5 x 7 mm in the lower pole.  - The upper pole lesion appears mildly larger compared to 2009, now 2.1 cm, previously 1.3 cm. This solid and cystic nodule appears to have been biopsied in 2009.    Isthmus Thickness: 5 mm. There is a cystic 18 mm lesion in the right isthmus  with colloid type internal echoes.    Lymphadenopathy None visualized.  2. Waves Htyr  PLAN: 1. MNG  - she developed more neck compression sxs (pressure during work, no dysphagia or SOB) and her goiter is larger on physical exam - I reviewed the images of her thyroid ultrasound along with the pt. The whole thyroid volume is replaced by cystic structures, not much changed from 2009, when 2 of these cysts were Bx'ed (benign).  - since she has neck pressure >> I suggested to aspirate the 3 largest thyroid cysts (2 in R lobe and 1 in isthmus >> she agrees - if pressure reappears after this >> may need R + isthmic thyroidectomy - I would have preferred to send her for EtOH cyst injection also, but this is not done in [place] to the best of my knowledge  R thyroid cysts:   2. Subclinical hyperthyroidism - could have been related to her contrast substance that she received on 01/30/2015 or to an episode of thyroiditis -  Last TFTs obtained at last visit were all normal - we will recheck the tests today to see if improving. If improving, will just need to follow them. If worsening, will likely need to get an uptake and scan.  - I will see her back in 6 months, but will stay in touch until then.  Office Visit on 10/24/2015  Component Date Value Ref Range Status  . Free T4 10/24/2015 0.81  0.60 - 1.60 ng/dL Final  . T3, Free 10/24/2015 3.4  2.3 - 4.2 pg/mL Final  . TSH 10/24/2015 0.44  0.35 - 4.50 uIU/mL Final   TFTs are normal.

## 2015-10-24 NOTE — Patient Instructions (Signed)
Please stop at the lab.  We will call you with the schedule for the thyroid cyst drainage.  Please come back for a follow-up appointment in 6 months.

## 2015-10-25 ENCOUNTER — Encounter: Payer: Self-pay | Admitting: *Deleted

## 2015-11-01 ENCOUNTER — Telehealth: Payer: Self-pay | Admitting: *Deleted

## 2015-11-01 ENCOUNTER — Other Ambulatory Visit: Payer: Self-pay | Admitting: Internal Medicine

## 2015-11-01 DIAGNOSIS — E042 Nontoxic multinodular goiter: Secondary | ICD-10-CM

## 2015-11-01 NOTE — Telephone Encounter (Signed)
Thyroid US ordered.

## 2015-11-01 NOTE — Telephone Encounter (Signed)
Patient Name: [facility] Gender: Unknown DOB: May 08, 1966 Age: 49 Y 56 M 6 D Return Phone Number: Address: City/State/Zip: [place] [employer] Endocrinology Night - Client Client Site [facility] Phys[facility] Contact Type Call Call Type Page Only Caller Name Olin Hauser Relationship To Patient Provider Is this call to report lab results? No Return Phone Number Please choose phone number Initial Comment caller is Olin Hauser with [facility] - has a pt of [facility] there for a thyroid biopsy - states that they need to have a thyroid ultrasound done first. CB# [phone]

## 2015-11-01 NOTE — Telephone Encounter (Signed)
Tanya Armstrong, with [facility] called stating that before they can do a thyroid biopsy on the pt, they must have a U/S Thyroid in the last 6 months. They can do this, if you will order it today. Be advised.

## 2015-11-01 NOTE — Telephone Encounter (Signed)
Returned call and discussed with Olin Hauser.

## 2015-11-02 ENCOUNTER — Other Ambulatory Visit: Payer: Self-pay | Admitting: Internal Medicine

## 2015-11-02 ENCOUNTER — Telehealth: Payer: Self-pay | Admitting: *Deleted

## 2015-11-02 DIAGNOSIS — E042 Nontoxic multinodular goiter: Secondary | ICD-10-CM

## 2015-11-02 NOTE — Telephone Encounter (Signed)
Yes, will order.

## 2015-11-02 NOTE — Telephone Encounter (Signed)
Tanya Armstrong, with [facility] called stating the radiologist wants to do 3 biopsies of the thyroid. Will Dr Cruzita Lederer please order 1 more biopsy to be done? They need an order for each biopsy. Thank you.

## 2015-11-09 ENCOUNTER — Other Ambulatory Visit (HOSPITAL_COMMUNITY)
Admission: RE | Admit: 2015-11-09 | Discharge: 2015-11-09 | Disposition: A | Discharge status: Home / Self Care | Payer: BLUE CROSS/BLUE SHIELD | Source: Ambulatory Visit | Attending: Radiology | Admitting: Radiology

## 2015-11-09 ENCOUNTER — Ambulatory Visit
Admission: RE | Admit: 2015-11-09 | Discharge: 2015-11-09 | Disposition: A | Discharge status: Home / Self Care | Payer: BLUE CROSS/BLUE SHIELD | Source: Ambulatory Visit | Attending: Internal Medicine | Admitting: Internal Medicine

## 2015-11-09 DIAGNOSIS — E041 Nontoxic single thyroid nodule: Secondary | ICD-10-CM | POA: Diagnosis not present

## 2015-11-27 ENCOUNTER — Encounter: Payer: Self-pay | Admitting: Internal Medicine

## 2015-11-28 ENCOUNTER — Telehealth: Payer: Self-pay | Admitting: Internal Medicine

## 2015-11-28 NOTE — Telephone Encounter (Signed)
Called pt and advised her per Dr Arman Filter message. Pt is scheduled for 3:30 on Thurs. Be advised.

## 2015-11-28 NOTE — Telephone Encounter (Signed)
Please read message below and advise.  

## 2015-11-28 NOTE — Telephone Encounter (Signed)
Her thyroid is very swollen and painful with trouble swallowing

## 2015-11-28 NOTE — Telephone Encounter (Signed)
Take Advil for now but I do want to see her in clinic on Thu between 3 and 4 pm - I have an opening then.

## 2015-11-30 ENCOUNTER — Ambulatory Visit (INDEPENDENT_AMBULATORY_CARE_PROVIDER_SITE_OTHER): Payer: BLUE CROSS/BLUE SHIELD | Admitting: Internal Medicine

## 2015-11-30 ENCOUNTER — Encounter: Payer: Self-pay | Admitting: Internal Medicine

## 2015-11-30 VITALS — BP 132/80 | HR 100 | Temp 97.7°F | Ht 62.0 in | Wt 113.0 lb

## 2015-11-30 DIAGNOSIS — E042 Nontoxic multinodular goiter: Secondary | ICD-10-CM

## 2015-11-30 DIAGNOSIS — E0590 Thyrotoxicosis, unspecified without thyrotoxic crisis or storm: Secondary | ICD-10-CM

## 2015-11-30 NOTE — Progress Notes (Signed)
Patient ID: Tanya Armstrong, female   DOB: 05/14/1966, 49 y.o.   MRN: [id number]   HPI  Tanya Armstrong is a 49 y.o.-year-old female, initially referred by Dr. Johnnye Sima, in consultation for MNG and subclinical thyrotoxicosis. Last visit 1 mo ago.   Thyroid U/S (01/30/2015): multiple cysts - unchanged since 2009, when she had FNA of 2 of the dominant cysts: the LML and the RLL >> benign.  Of note, she had a contrasted CT abd and chest on 01/30/2015 for investigation for FUO.  She had the 3 largest cysts drained (11/09/2015). Symptoms were a little better after the drainage, however, ~1 mo ago >> she has again neck compression sxs - she called [place] yesterday and I asked her to come for a new appt today. She cannot sleep b/c neck pressure. 1 week ago, she could not even swallow food >> now better. No hoarseness, odynophagia.  R thyroid cysts:   I reviewed pt's thyroid tests: Lab Results  Component Value Date   TSH 0.44 10/24/2015   TSH 0.53 02/23/2015   TSH 0.262* 02/14/2015   TSH 0.331* 01/30/2015   FREET4 0.81 10/24/2015   FREET4 0.80 02/23/2015   FREET4 1.38 01/30/2015   Pt denies: - fatigue (recent, since she is not sleeping well) - heat intolerance/cold intolerance - tremors - palpitations - anxiety/depression - hyperdefecation/constipation - weight loss/gain - dry skin - hair loss  Pt does not have a FH of thyroid ds. No FH of thyroid cancer. No h/o radiation tx to head or neck.  No seaweed or kelp, no recent contrast studies (last 01/30/2015). No steroid use. No herbal supplements.   I reviewed her chart and she also has a history of HL, FUO (as mentioned above).  ROS: Constitutional: no weight gain/loss, no fatigue, no subjective hyperthermia/hypothermia Eyes: no blurry vision, no xerophthalmia ENT: no sore throat, + see HPU Cardiovascular: no CP/SOB/palpitations/leg swelling Respiratory: no cough/SOB Gastrointestinal: no N/V/D/C Musculoskeletal: no muscle/joint  aches Skin: no rashes Neurological: no tremors/numbness/tingling/dizziness Psychiatric: no depression/anxiety  Past Medical History  Diagnosis Date  . FUO (fever of unknown origin)   . Multiple thyroid nodules    No past surgical history on file.   History   Social History  . Marital Status: Married    Spouse Name: N/A  . Number of Children: 2   Occupational History  . n/a   Social History Main Topics  . Smoking status: Never Smoker   . Smokeless tobacco: Never Used  . Alcohol Use: No  . Drug Use: No   Current Outpatient Prescriptions on File Prior to Visit  Medication Sig Dispense Refill  . acetaminophen (TYLENOL) 325 MG tablet Take 2 tablets (650 mg total) by mouth every 6 (six) hours as needed for mild pain (or Fever >/= 101). (Patient not taking: Reported on 02/14/2015) 60 tablet 2  . cyclobenzaprine (FLEXERIL) 5 MG tablet TAKE 1 TABLET BY MOUTH TWICE A DAY AS NEEDED FOR SPASM  0  . Multiple Vitamin (MULTIVITAMIN) tablet Take 1 tablet by mouth daily.     No current facility-administered medications on file prior to visit.   No Known Allergies   No family history on file.  PE: BP 132/80 mmHg  Pulse 100  Temp(Src) 97.7 F (36.5 C) (Oral)  Ht 5\' 2"  (1.575 m)  Wt 113 lb (51.256 kg)  BMI 20.66 kg/m2  SpO2 96% Body mass index is 20.66 kg/(m^2). Wt Readings from Last 3 Encounters:  11/30/15 113 lb (51.256 kg)  10/24/15  110 lb (49.896 kg)  06/15/15 108 lb 9.6 oz (49.261 kg)   Constitutional: underweight, in NAD Eyes: PERRLA, EOMI, no exophthalmos ENT: moist mucous membranes, +++ protruding thyromegaly - R lobe > L lobe and isthmus, no cervical lymphadenopathy Cardiovascular: tachycardia, RR, No MRG Respiratory: CTA B Gastrointestinal: abdomen soft, NT, ND, BS+ Musculoskeletal: no deformities, strength intact in all 4;  Skin: moist, warm, no rashes Neurological: very fine tremor with outstretched hands, DTR normal in all 4  ASSESSMENT: 1. MNG Thyroid U/S  (01/30/2015):  Right thyroid lobe Measurements: 6.5 x 3.2 x 3.6 cm. There are complex, primarily cystic nodules throughout the right lobe.  - In the upper pole, the largest multi cystic nodule measures 2.7 x 1.3 x 1.7 cm. In the right lobe this is the nodule with the most solid component and is in the upper pole, and appears similar to 2009 when accounting for measurement error.  - In the right lower pole there is a cluster of 3 cystic nodules with septations and solid areas, 1.4, 2.1, and 3.1 cm in maximal diameter. Some of the cysts contain echogenic foci with ring down artifact consistent with colloid.    Left thyroid lobe Measurements: 5.5 x 1.3 x 1.6 cm.  - There are 2 measured complex  cystic lesions, 2.1 x 1 x 1 cm in the upper pole and 8 x 5 x 7 mm in the lower pole.  - The upper pole lesion appears mildly larger compared to 2009, now 2.1 cm, previously 1.3 cm. This solid and cystic nodule appears to have been biopsied in 2009.    Isthmus Thickness: 5 mm. There is a cystic 18 mm lesion in the right isthmus  with colloid type internal echoes.    Lymphadenopathy None visualized.  2. Subclinical hyperthyroidism  PLAN: 1. MNG  - Patient presented as an acute appointment for increased neck pressure in the last week. She describes that approximately a week ago, she had such severe pressure in her neck that she could not eat or sleep. She is still not sleeping well, feels a very heavy sensation in her neck, especially on the right side, but she can eat better now. No shortness of breath. She has to lay on her side at night to be able to add some rest. - I reviewed the images of her thyroid ultrasound from 01/2015 >> The whole thyroid volume is replaced by cystic structures, not much changed from 2009, when 2 of these cysts were Bx'ed (benign). She had aspiration of the 3 largest thyroid cysts (2 in R lobe and 1 in isthmus >> slight relief of neck pressure, but this is now again very  uncomfortable - At this visit, she has a very tight right thyroid, without any evidence of improvement after her cyst aspiration. I believe that she needs surgery. I will suggest total thyroidectomy, since she has some increase in size of her left thyroid, also. She agrees with this. - We discussed about replacement with levothyroxine after surgery. She is very taken aback about the fact that she will need to be on this medication for her whole life. She repeatedly tells me that in her country ([place]), people have thyroidectomies and they do not need to take thyroid medication for the rest of her life (which is sad...). I did my best to enforce the absolute need to take thyroid hormone every day. I also explained how to take it correctly and I gave her the directions in writing. Her sister is  a physician in [place] and she will discuss with her about this also. - Referral placed to Dr. Harlow Asa.  - I will see her back approximately 1 month after the surgery. - Until the surgery, try to manage swelling with Tylenol and ibuprofen.  2. Subclinical hyperthyroidism - could have been related to her contrast substance that she received on 01/30/2015 or to an episode of thyroiditis - Last TFTs obtained at last visit were all normal  Patient Instructions  Please take Tylenol and Ibuprofen.  You will be called with the referral to surgery (Dr. Harlow Asa).  I would like to see you back 1 month after the surgery.  After the surgery, you will need to take thyroid hormones EVERY DAY.  Take the thyroid hormone every day, with water, >30 minutes before breakfast, separated by >4 hours from: - acid reflux medications - calcium - iron - multivitamins.  Please move the vitamins at night.   - time spent with the patient: 40 minutes, of which >50% was spent in obtaining information about her symptoms, reviewing her previous labs, imaging tests, and treatments, counseling her about her condition (please see the  discussed topics above), and developing a plan to treat; she had a number of questions which I addressed.

## 2015-11-30 NOTE — Patient Instructions (Signed)
Please take Tylenol and Ibuprofen.  You will be called with the referral to surgery (Dr. Harlow Asa).  I would like to see you back 1 month after the surgery.  After the surgery, you will need to take thyroid hormones EVERY DAY.  Take the thyroid hormone every day, with water, >30 minutes before breakfast, separated by >4 hours from: - acid reflux medications - calcium - iron - multivitamins.  Please move the vitamins at night.

## 2015-12-20 ENCOUNTER — Ambulatory Visit: Payer: Self-pay | Admitting: General Surgery

## 2016-01-25 ENCOUNTER — Ambulatory Visit: Payer: BLUE CROSS/BLUE SHIELD | Admitting: Internal Medicine

## 2016-01-30 ENCOUNTER — Encounter (HOSPITAL_COMMUNITY)
Admission: RE | Admit: 2016-01-30 | Discharge: 2016-01-30 | Disposition: A | Discharge status: Home / Self Care | Payer: BLUE CROSS/BLUE SHIELD | Source: Ambulatory Visit | Attending: General Surgery | Admitting: General Surgery

## 2016-01-30 ENCOUNTER — Ambulatory Visit (HOSPITAL_COMMUNITY)
Admission: RE | Admit: 2016-01-30 | Discharge: 2016-01-30 | Disposition: A | Discharge status: Home / Self Care | Payer: BLUE CROSS/BLUE SHIELD | Source: Ambulatory Visit | Attending: Anesthesiology | Admitting: Anesthesiology

## 2016-01-30 ENCOUNTER — Encounter (HOSPITAL_COMMUNITY): Payer: Self-pay

## 2016-01-30 DIAGNOSIS — E041 Nontoxic single thyroid nodule: Secondary | ICD-10-CM | POA: Diagnosis not present

## 2016-01-30 DIAGNOSIS — R05 Cough: Secondary | ICD-10-CM | POA: Insufficient documentation

## 2016-01-30 DIAGNOSIS — Z01818 Encounter for other preprocedural examination: Secondary | ICD-10-CM | POA: Diagnosis not present

## 2016-01-30 HISTORY — DX: Nontoxic single thyroid nodule: E04.1

## 2016-01-30 HISTORY — DX: Other diseases of pharynx: J39.2

## 2016-01-30 HISTORY — DX: Personal history of other complications of pregnancy, childbirth and the puerperium: Z87.59

## 2016-01-30 HISTORY — DX: Cough: R05

## 2016-01-30 HISTORY — DX: Abdominal distension (gaseous): R14.0

## 2016-01-30 HISTORY — DX: Cough, unspecified: R05.9

## 2016-01-30 HISTORY — DX: Headache, unspecified: R51.9

## 2016-01-30 HISTORY — DX: Headache: R51

## 2016-01-30 LAB — CBC
HCT: 41.4 % (ref 36.0–46.0)
Hemoglobin: 14 g/dL (ref 12.0–15.0)
MCH: 30.2 pg (ref 26.0–34.0)
MCHC: 33.8 g/dL (ref 30.0–36.0)
MCV: 89.4 fL (ref 78.0–100.0)
PLATELETS: 207 10*3/uL (ref 150–400)
RBC: 4.63 MIL/uL (ref 3.87–5.11)
RDW: 13.3 % (ref 11.5–15.5)
WBC: 7.8 10*3/uL (ref 4.0–10.5)

## 2016-01-30 LAB — BASIC METABOLIC PANEL
Anion gap: 11 (ref 5–15)
BUN: 15 mg/dL (ref 6–20)
CALCIUM: 9.8 mg/dL (ref 8.9–10.3)
CHLORIDE: 103 mmol/L (ref 101–111)
CO2: 26 mmol/L (ref 22–32)
CREATININE: 0.58 mg/dL (ref 0.44–1.00)
GFR calc non Af Amer: >60 mL/min (ref >60)
Glucose, Bld: 106 mg/dL — ABNORMAL HIGH (ref 65–99)
Potassium: 4 mmol/L (ref 3.5–5.1)
Sodium: 140 mmol/L (ref 135–145)

## 2016-01-30 LAB — HCG, SERUM, QUALITATIVE: PREG SERUM: NEGATIVE

## 2016-01-30 NOTE — Patient Instructions (Signed)
Therma Righter  01/30/2016   Your procedure is scheduled on: Friday February 02, 2016  Report to [facility] Main  Entrance take [place]  elevators to 3rd floor to  [place] at 7:00 AM.  Call this number if you have problems the morning of surgery [phone]   Remember: ONLY 1 PERSON MAY GO WITH YOU TO SHORT STAY TO GET  READY MORNING OF [place].  Do not eat food or drink liquids :After Midnight.     Take these medicines the morning of surgery with A SIP OF WATER: NONE              You may not have any metal on your body including hair pins and              piercings  Do not wear jewelry, make-up, lotions, powders or perfumes, deodorant             Do not wear nail polish.  Do not shave  48 hours prior to surgery.               Do not bring valuables to the hospital. [place].  Contacts, dentures or bridgework may not be worn into surgery.  Leave suitcase in the car. After surgery it may be brought to your room.   _____________________________________________________________________             [facility] - Preparing for Surgery Before surgery, you can play an important role.  Because skin is not sterile, your skin needs to be as free of germs as possible.  You can reduce the number of germs on your skin by washing with CHG (chlorahexidine gluconate) soap before surgery.  CHG is an antiseptic cleaner which kills germs and bonds with the skin to continue killing germs even after washing. Please DO NOT use if you have an allergy to CHG or antibacterial soaps.  If your skin becomes reddened/irritated stop using the CHG and inform your nurse when you arrive at Short Stay. Do not shave (including legs and underarms) for at least 48 hours prior to the first CHG shower.  You may shave your face/neck. Please follow these instructions carefully:  1.  Shower with CHG Soap the night before surgery and the   morning of Surgery.  2.  If you choose to wash your hair, wash your hair first as usual with your  normal  shampoo.  3.  After you shampoo, rinse your hair and body thoroughly to remove the  shampoo.                           4.  Use CHG as you would any other liquid soap.  You can apply chg directly  to the skin and wash                       Gently with a scrungie or clean washcloth.  5.  Apply the CHG Soap to your body ONLY FROM THE NECK DOWN.   Do not use on face/ open                           Wound or open sores. Avoid contact  with eyes, ears mouth and genitals (private parts).                       Wash face,  Genitals (private parts) with your normal soap.             6.  Wash thoroughly, paying special attention to the area where your surgery  will be performed.  7.  Thoroughly rinse your body with warm water from the neck down.  8.  DO NOT shower/wash with your normal soap after using and rinsing off  the CHG Soap.                9.  Pat yourself dry with a clean towel.            10.  Wear clean pajamas.            11.  Place clean sheets on your bed the night of your first shower and do not  sleep with pets. Day of Surgery : Do not apply any lotions/deodorants the morning of surgery.  Please wear clean clothes to the hospital/surgery center.  FAILURE TO FOLLOW THESE INSTRUCTIONS MAY RESULT IN THE CANCELLATION OF YOUR SURGERY PATIENT SIGNATURE_________________________________  NURSE SIGNATURE__________________________________  ________________________________________________________________________

## 2016-02-02 ENCOUNTER — Observation Stay (HOSPITAL_COMMUNITY)
Admission: RE | Admit: 2016-02-02 | Discharge: 2016-02-03 | Disposition: A | Discharge status: Home / Self Care | Payer: BLUE CROSS/BLUE SHIELD | Source: Ambulatory Visit | Attending: General Surgery | Admitting: General Surgery

## 2016-02-02 ENCOUNTER — Ambulatory Visit (HOSPITAL_COMMUNITY): Payer: BLUE CROSS/BLUE SHIELD | Admitting: Anesthesiology

## 2016-02-02 ENCOUNTER — Encounter (HOSPITAL_COMMUNITY)
Admission: RE | Disposition: A | Discharge status: Home / Self Care | Payer: Self-pay | Source: Ambulatory Visit | Attending: General Surgery

## 2016-02-02 ENCOUNTER — Encounter (HOSPITAL_COMMUNITY): Payer: Self-pay | Admitting: *Deleted

## 2016-02-02 DIAGNOSIS — R1310 Dysphagia, unspecified: Secondary | ICD-10-CM | POA: Diagnosis not present

## 2016-02-02 DIAGNOSIS — Z9089 Acquired absence of other organs: Secondary | ICD-10-CM

## 2016-02-02 DIAGNOSIS — E041 Nontoxic single thyroid nodule: Principal | ICD-10-CM | POA: Insufficient documentation

## 2016-02-02 DIAGNOSIS — E890 Postprocedural hypothyroidism: Secondary | ICD-10-CM

## 2016-02-02 DIAGNOSIS — Z79899 Other long term (current) drug therapy: Secondary | ICD-10-CM | POA: Insufficient documentation

## 2016-02-02 HISTORY — PX: THYROIDECTOMY: SHX17

## 2016-02-02 SURGERY — THYROIDECTOMY
Anesthesia: General

## 2016-02-02 MED ORDER — SUGAMMADEX SODIUM 200 MG/2ML IV SOLN
INTRAVENOUS | Status: AC
  Filled 2016-02-02: qty 2

## 2016-02-02 MED ORDER — FENTANYL CITRATE (PF) 100 MCG/2ML IJ SOLN
25.0000 ug | INTRAMUSCULAR | Status: DC | PRN
  Administered 2016-02-02: 25 ug via INTRAVENOUS

## 2016-02-02 MED ORDER — FENTANYL CITRATE (PF) 100 MCG/2ML IJ SOLN
INTRAMUSCULAR | Status: AC
  Filled 2016-02-02: qty 2

## 2016-02-02 MED ORDER — FENTANYL CITRATE (PF) 250 MCG/5ML IJ SOLN
INTRAMUSCULAR | Status: AC
  Filled 2016-02-02: qty 5

## 2016-02-02 MED ORDER — ROCURONIUM BROMIDE 100 MG/10ML IV SOLN
INTRAVENOUS | Status: AC
  Filled 2016-02-02: qty 1

## 2016-02-02 MED ORDER — HYDRALAZINE HCL 20 MG/ML IJ SOLN
10.0000 mg | INTRAMUSCULAR | Status: DC | PRN
Start: 2016-02-02 — End: 2016-02-03

## 2016-02-02 MED ORDER — MIDAZOLAM HCL 5 MG/5ML IJ SOLN
INTRAMUSCULAR | Status: DC | PRN
  Administered 2016-02-02: 2 mg via INTRAVENOUS

## 2016-02-02 MED ORDER — CEFAZOLIN SODIUM-DEXTROSE 2-3 GM-% IV SOLR
2.0000 g | INTRAVENOUS | Status: AC
  Administered 2016-02-02: 2 g via INTRAVENOUS

## 2016-02-02 MED ORDER — DEXAMETHASONE SODIUM PHOSPHATE 4 MG/ML IJ SOLN
INTRAMUSCULAR | Status: DC | PRN
  Administered 2016-02-02: 10 mg via INTRAVENOUS

## 2016-02-02 MED ORDER — CHLORHEXIDINE GLUCONATE 4 % EX LIQD: 1.0000 "application " | Freq: Once | CUTANEOUS | Status: DC

## 2016-02-02 MED ORDER — ONDANSETRON HCL 4 MG/2ML IJ SOLN
INTRAMUSCULAR | Status: DC | PRN
  Administered 2016-02-02: 4 mg via INTRAVENOUS

## 2016-02-02 MED ORDER — ONDANSETRON HCL 4 MG/2ML IJ SOLN
INTRAMUSCULAR | Status: AC
  Filled 2016-02-02: qty 2

## 2016-02-02 MED ORDER — CEFAZOLIN SODIUM-DEXTROSE 2-3 GM-% IV SOLR
INTRAVENOUS | Status: AC
  Filled 2016-02-02: qty 50

## 2016-02-02 MED ORDER — MAGNESIUM OXIDE 400 (241.3 MG) MG PO TABS
200.0000 mg | ORAL_TABLET | Freq: Two times a day (BID) | ORAL | Status: DC
  Administered 2016-02-02 – 2016-02-03 (×2): 200 mg via ORAL
  Filled 2016-02-02 (×6): qty 0.5

## 2016-02-02 MED ORDER — CALCIUM CARBONATE-VITAMIN D 500-200 MG-UNIT PO TABS
2.0000 | ORAL_TABLET | Freq: Three times a day (TID) | ORAL | Status: AC
Start: 2016-02-02 — End: ?

## 2016-02-02 MED ORDER — FENTANYL CITRATE (PF) 100 MCG/2ML IJ SOLN
INTRAMUSCULAR | Status: DC | PRN
  Administered 2016-02-02 (×2): 100 ug via INTRAVENOUS

## 2016-02-02 MED ORDER — PHENYLEPHRINE HCL 10 MG/ML IJ SOLN
INTRAMUSCULAR | Status: DC | PRN
  Administered 2016-02-02 (×6): 40 ug via INTRAVENOUS

## 2016-02-02 MED ORDER — PROMETHAZINE HCL 25 MG/ML IJ SOLN
INTRAMUSCULAR | Status: AC
  Filled 2016-02-02: qty 1

## 2016-02-02 MED ORDER — ONDANSETRON 4 MG PO TBDP
4.0000 mg | ORAL_TABLET | Freq: Four times a day (QID) | ORAL | Status: DC | PRN
  Filled 2016-02-02: qty 1

## 2016-02-02 MED ORDER — DIPHENHYDRAMINE HCL 12.5 MG/5ML PO ELIX
12.5000 mg | ORAL_SOLUTION | Freq: Four times a day (QID) | ORAL | Status: DC | PRN
  Filled 2016-02-02: qty 5

## 2016-02-02 MED ORDER — MAGNESIUM OXIDE 400 MG PO CAPS: 400.0000 mg | ORAL_CAPSULE | Freq: Two times a day (BID) | ORAL | Status: DC

## 2016-02-02 MED ORDER — SODIUM CHLORIDE 0.9 % IJ SOLN
INTRAMUSCULAR | Status: AC
  Filled 2016-02-02: qty 10

## 2016-02-02 MED ORDER — PROMETHAZINE HCL 25 MG/ML IJ SOLN
6.2500 mg | INTRAMUSCULAR | Status: DC | PRN
  Administered 2016-02-02: 6.25 mg via INTRAVENOUS

## 2016-02-02 MED ORDER — OXYCODONE-ACETAMINOPHEN 5-325 MG PO TABS: 1.0000 | ORAL_TABLET | ORAL | Status: DC | PRN

## 2016-02-02 MED ORDER — LEVOTHYROXINE SODIUM 88 MCG PO TABS: 88.0000 ug | ORAL_TABLET | Freq: Every day | ORAL | Status: DC

## 2016-02-02 MED ORDER — SUGAMMADEX SODIUM 500 MG/5ML IV SOLN
INTRAVENOUS | Status: DC | PRN
  Administered 2016-02-02: 150 mg via INTRAVENOUS

## 2016-02-02 MED ORDER — DIPHENHYDRAMINE HCL 50 MG/ML IJ SOLN: 12.5000 mg | Freq: Four times a day (QID) | INTRAMUSCULAR | Status: DC | PRN

## 2016-02-02 MED ORDER — BUPIVACAINE-EPINEPHRINE (PF) 0.25% -1:200000 IJ SOLN
INTRAMUSCULAR | Status: AC
  Filled 2016-02-02: qty 30

## 2016-02-02 MED ORDER — LIDOCAINE HCL (CARDIAC) 20 MG/ML IV SOLN
INTRAVENOUS | Status: AC
  Filled 2016-02-02: qty 5

## 2016-02-02 MED ORDER — LIDOCAINE HCL (CARDIAC) 20 MG/ML IV SOLN
INTRAVENOUS | Status: DC | PRN
  Administered 2016-02-02: 60 mg via INTRAVENOUS

## 2016-02-02 MED ORDER — MIDAZOLAM HCL 2 MG/2ML IJ SOLN
INTRAMUSCULAR | Status: AC
  Filled 2016-02-02: qty 2

## 2016-02-02 MED ORDER — ROCURONIUM BROMIDE 100 MG/10ML IV SOLN
INTRAVENOUS | Status: DC | PRN
  Administered 2016-02-02 (×2): 5 mg via INTRAVENOUS
  Administered 2016-02-02: 10 mg via INTRAVENOUS
  Administered 2016-02-02: 40 mg via INTRAVENOUS

## 2016-02-02 MED ORDER — PHENYLEPHRINE 40 MCG/ML (10ML) SYRINGE FOR IV PUSH (FOR BLOOD PRESSURE SUPPORT)
PREFILLED_SYRINGE | INTRAVENOUS | Status: AC
  Filled 2016-02-02: qty 10

## 2016-02-02 MED ORDER — DEXAMETHASONE SODIUM PHOSPHATE 10 MG/ML IJ SOLN
INTRAMUSCULAR | Status: AC
  Filled 2016-02-02: qty 1

## 2016-02-02 MED ORDER — ONDANSETRON HCL 4 MG/2ML IJ SOLN: 4.0000 mg | Freq: Four times a day (QID) | INTRAMUSCULAR | Status: DC | PRN

## 2016-02-02 MED ORDER — PROPOFOL 10 MG/ML IV BOLUS
INTRAVENOUS | Status: AC
  Filled 2016-02-02: qty 20

## 2016-02-02 MED ORDER — BUPIVACAINE-EPINEPHRINE 0.25% -1:200000 IJ SOLN
INTRAMUSCULAR | Status: DC | PRN
  Administered 2016-02-02: 4 mL

## 2016-02-02 MED ORDER — CALCIUM CARBONATE-VITAMIN D 500-200 MG-UNIT PO TABS
2.0000 | ORAL_TABLET | Freq: Three times a day (TID) | ORAL | Status: DC
  Administered 2016-02-02 – 2016-02-03 (×2): 2 via ORAL
  Filled 2016-02-02 (×7): qty 2

## 2016-02-02 MED ORDER — HYDROMORPHONE HCL 1 MG/ML IJ SOLN: 0.5000 mg | INTRAMUSCULAR | Status: DC | PRN

## 2016-02-02 MED ORDER — DEXTROSE-NACL 5-0.9 % IV SOLN
INTRAVENOUS | Status: DC
  Administered 2016-02-02: 16:00:00 via INTRAVENOUS

## 2016-02-02 MED ORDER — OXYCODONE-ACETAMINOPHEN 5-325 MG PO TABS
1.0000 | ORAL_TABLET | ORAL | Status: DC | PRN
  Filled 2016-02-02: qty 1

## 2016-02-02 MED ORDER — LACTATED RINGERS IV SOLN
INTRAVENOUS | Status: DC
  Administered 2016-02-02: 08:00:00 via INTRAVENOUS
  Administered 2016-02-02: 1000 mL via INTRAVENOUS
  Administered 2016-02-02: 11:00:00 via INTRAVENOUS

## 2016-02-02 MED ORDER — PROPOFOL 10 MG/ML IV BOLUS
INTRAVENOUS | Status: DC | PRN
  Administered 2016-02-02: 100 mg via INTRAVENOUS

## 2016-02-02 SURGICAL SUPPLY — 42 items
APL SKNCLS STERI-STRIP NONHPOA (GAUZE/BANDAGES/DRESSINGS)
ATTRACTOMAT 16X20 MAGNETIC DRP (DRAPES) ×3 IMPLANT
BENZOIN TINCTURE PRP APPL 2/3 (GAUZE/BANDAGES/DRESSINGS) IMPLANT
BLADE HEX COATED 2.75 (ELECTRODE) ×3 IMPLANT
BLADE SURG 15 STRL LF DISP TIS (BLADE) ×1 IMPLANT
BLADE SURG 15 STRL SS (BLADE) ×3
CHLORAPREP W/TINT 26ML (MISCELLANEOUS) ×3 IMPLANT
CLIP TI MEDIUM 6 (CLIP) ×9 IMPLANT
CLIP TI WIDE RED SMALL 6 (CLIP) ×9 IMPLANT
CLOSURE WOUND 1/2 X4 (GAUZE/BANDAGES/DRESSINGS)
COVER SURGICAL LIGHT HANDLE (MISCELLANEOUS) ×3 IMPLANT
DISSECTOR ROUND CHERRY 3/8 STR (MISCELLANEOUS) ×3 IMPLANT
DRAPE LAPAROTOMY T 98X78 PEDS (DRAPES) ×3 IMPLANT
DRESSING SURGICEL FIBRLLR 1X2 (HEMOSTASIS) ×1 IMPLANT
DRSG SURGICEL FIBRILLAR 1X2 (HEMOSTASIS) ×3
ELECT PENCIL ROCKER SW 15FT (MISCELLANEOUS) ×3 IMPLANT
ELECT REM PT RETURN 9FT ADLT (ELECTROSURGICAL) ×3
ELECTRODE REM PT RTRN 9FT ADLT (ELECTROSURGICAL) ×1 IMPLANT
GAUZE SPONGE 4X4 12PLY STRL (GAUZE/BANDAGES/DRESSINGS) ×3 IMPLANT
GAUZE SPONGE 4X4 16PLY XRAY LF (GAUZE/BANDAGES/DRESSINGS) ×3 IMPLANT
GLOVE BIO SURGEON STRL SZ7.5 (GLOVE) ×3 IMPLANT
GOWN STRL REUS W/TWL XL LVL3 (GOWN DISPOSABLE) ×6 IMPLANT
HEMOSTAT SURGICEL 2X4 FIBR (HEMOSTASIS) ×3 IMPLANT
KIT BASIN OR (CUSTOM PROCEDURE TRAY) ×3 IMPLANT
LIQUID BAND (GAUZE/BANDAGES/DRESSINGS) ×3 IMPLANT
NEEDLE HYPO 25X1 1.5 SAFETY (NEEDLE) ×3 IMPLANT
PACK BASIC VI WITH GOWN DISP (CUSTOM PROCEDURE TRAY) ×3 IMPLANT
SHEARS HARMONIC 9CM CVD (BLADE) ×3 IMPLANT
STAPLER VISISTAT 35W (STAPLE) ×3 IMPLANT
STRIP CLOSURE SKIN 1/2X4 (GAUZE/BANDAGES/DRESSINGS) IMPLANT
SUT MNCRL AB 4-0 PS2 18 (SUTURE) ×3 IMPLANT
SUT SILK 2 0 (SUTURE)
SUT SILK 2 0 SH (SUTURE) IMPLANT
SUT SILK 2-0 18XBRD TIE 12 (SUTURE) IMPLANT
SUT SILK 3 0 (SUTURE) ×3
SUT SILK 3-0 18XBRD TIE 12 (SUTURE) ×1 IMPLANT
SUT VIC AB 3-0 SH 18 (SUTURE) ×6 IMPLANT
SYR 20CC LL (SYRINGE) ×3 IMPLANT
SYR BULB IRRIGATION 50ML (SYRINGE) ×3 IMPLANT
TOWEL OR 17X26 10 PK STRL BLUE (TOWEL DISPOSABLE) ×3 IMPLANT
TOWEL OR NON WOVEN STRL DISP B (DISPOSABLE) ×3 IMPLANT
YANKAUER SUCT BULB TIP 10FT TU (MISCELLANEOUS) ×3 IMPLANT

## 2016-02-02 NOTE — Op Note (Signed)
02/02/2016  11:51 AM  PATIENT:  Tanya Armstrong  50 y.o. female  PRE-OPERATIVE DIAGNOSIS:  THYROID NODULE  POST-OPERATIVE DIAGNOSIS:  THYROID NODULE  PROCEDURE:  Procedure(s): TOTAL THYROIDECTOMY (N/A)  SURGEON:  Surgeon(s) and Role:    * Ralene Ok, MD - Primary    * Excell Seltzer, MD - Assisting  ANESTHESIA:   local and general  EBL:25cc   Total I/O In: 1000 [I.V.:1000] Out: -   BLOOD ADMINISTERED:none  DRAINS: none   LOCAL MEDICATIONS USED:  BUPIVICAINE   SPECIMEN:  Source of Specimen:  Total thyroid with stitch in R superior lobe  DISPOSITION OF SPECIMEN:  PATHOLOGY  COUNTS:  YES  TOURNIQUET:  * No tourniquets in log *  DICTATION: .Dragon Dictation   Findings: Large right thyroid nodule. Small portion of right thyroid left onto what appeared to be the right recurrent laryngeal nerve.  Indications for procedure: The patient is a 50 year old [place] female who had a multiple thyroid nodules.   Details of the procedure:  The patient was taken back to the operating room. The patient was placed in supine position with bilateral SCDs in place.  The patient was prepped and draped in the usual sterile fashion. After appropriate anitbiotics were confirmed, a time-out was confirmed and all facts were verified.   A 4 cm incision was made approximately 2 fingerbreadths above the sternal notch. Bovie cautery was used to maintain hemostasis dissection was carried down through the platysma. The platysma was elevated and flaps were created superiorly and inferiorly to the thyroid cartilage as well as the sternal notch, repsectively. The strap muscles were identified in the midline and separated. Right-sided strap muscles were elevated off the anterior surface of the thyroid. This dissection was carried laterally. The middle thyroid vein was identified and doubly ligated. We proceeded to dissect away the superior lobe and Kitners were used to gently dissect the surrounding  musculature from the thyroid. The superior thyroid vessels were identified and doubly ligated . At this time this freed up the superior lobe was able to deliver this into the wound. We also identified the superior parathyroid gland which we preserved. We continued to dissect the thyroid off of the trachea from lateral to medial direction. The right recurrent laryngeal nerve was identified and protected. there was a large nodule which appeared to be adherent to the right recurrent laryngeal nerve. This node was dissected off and left with the nerve intact. We proceeded to dissect the thyroid inferiorly and inferior parathyroid was identified and preserved. The inferior thyroid vessels were identified and doubly ligated with clips. At this time Berry's ligament was dissected away from the trachea.   The exact dissection took place in the left side. The left superior parathyroid gland was identified and left with the patient. The left recurrent laryngeal nerve was also visualized and dissected from the thyroid gland.  The left inferior parathyroid gland was not visualized.  A superior stitch was then placed in the superior thyroid lobe. The area was irrigated out. The dissection bed was hemostatic. We placed fibrillar hemostatic agent into the wound. Strap muscles were then reapproximated in the midline with interrupted 3-0 Vicryl stitches. The platysma was reapproximated using 3-0 Vicryl stitches in interrupted fashion. Skin was then reapproximated using a running subcuticular 4-0 Monocryl. The skin was then dressed with Liquiband. The patient was taken to the recovery room in stable condition.   PLAN OF CARE: Admit for overnight observation  PATIENT DISPOSITION:  PACU - hemodynamically stable.  Delay start of Pharmacological VTE agent (>24hrs) due to surgical blood loss or risk of bleeding: not applicable  

## 2016-02-02 NOTE — Anesthesia Preprocedure Evaluation (Addendum)
Anesthesia Evaluation  Patient identified by MRN, date of birth, ID band Patient awake    Reviewed: Allergy & Precautions, NPO status , Patient's Chart, lab work & pertinent test results  History of Anesthesia Complications Negative for: history of anesthetic complications  Airway Mallampati: II  TM Distance: >3 FB Neck ROM: Full    Dental  (+) Teeth Intact, Dental Advisory Given   Pulmonary neg pulmonary ROS,    Pulmonary exam normal breath sounds clear to auscultation       Cardiovascular Exercise Tolerance: Good negative cardio ROS Normal cardiovascular exam Rhythm:Regular Rate:Normal     Neuro/Psych  Headaches, negative psych ROS   GI/Hepatic negative GI ROS, Neg liver ROS,   Endo/Other  Thyroid cyst  Renal/GU negative Renal ROS     Musculoskeletal negative musculoskeletal ROS (+)   Abdominal   Peds  Hematology negative hematology ROS (+)   Anesthesia Other Findings Day of surgery medications reviewed with the patient.  Reproductive/Obstetrics negative OB ROS                            Anesthesia Physical Anesthesia Plan  ASA: II  Anesthesia Plan: General   Post-op Pain Management:    Induction: Intravenous  Airway Management Planned: Oral ETT  Additional Equipment:   Intra-op Plan:   Post-operative Plan: Extubation in OR  Informed Consent: I have reviewed the patients History and Physical, chart, labs and discussed the procedure including the risks, benefits and alternatives for the proposed anesthesia with the patient or authorized representative who has indicated his/her understanding and acceptance.   Dental advisory given  Plan Discussed with: CRNA  Anesthesia Plan Comments: (Risks/benefits of general anesthesia discussed with patient including risk of damage to teeth, lips, gum, and tongue, nausea/vomiting, allergic reactions to medications, and the possibility  of heart attack, stroke and death.  All patient questions answered.  Patient wishes to proceed.  Interpreter used for entire patient interview.  All questions answered.)       Anesthesia Quick Evaluation

## 2016-02-02 NOTE — H&P (Signed)
Tanya Armstrong is an 50 y.o. female.   Chief Complaint: thryoid mass HPI: The patient is a 50 year old female who presents for evaluation of a thyroid disorder. 50 year old female who is referred by Dr. Christianne Dolin for evaluation of thyroid cyst. Patient has had several aspirations of the cysts. Cysts ultrasound has revealed cyst to the right side however the left side does have cysts that appear to be enlarging. Patient does state that she has some pressure/dysphagia.  Patient is had these previously biopsy which revealed normal pathology in 2009.  Past Medical History  Diagnosis Date  . Thyroid cyst   . Abdominal bloating   . Headache   . Dry throat   . Cough   . H/O one miscarriage     Past Surgical History  Procedure Laterality Date  . Dilation and curettage of uterus      No family history on file. Social History:  reports that she has never smoked. She has never used smokeless tobacco. She reports that she does not drink alcohol or use illicit drugs.  Allergies: No Known Allergies  Medications Prior to Admission  Medication Sig Dispense Refill  . acetaminophen (TYLENOL) 500 MG tablet Take 1,000 mg by mouth every 6 (six) hours as needed for moderate pain.    . Cholecalciferol (VITAMIN D PO) Take 1 tablet by mouth daily.    Marland Kitchen glucosamine-chondroitin 500-400 MG tablet Take 1 tablet by mouth 3 (three) times daily.    . Multiple Vitamin (MULTIVITAMIN WITH MINERALS) TABS tablet Take 2 tablets by mouth daily.       No results found for this or any previous visit (from the past 48 hour(s)). No results found.  Review of Systems  Constitutional: Negative.   HENT: Negative.   Respiratory: Negative.   Cardiovascular: Negative.   Genitourinary: Negative.   Musculoskeletal: Negative.     Blood pressure 131/95, pulse 115, temperature 97.8 F (36.6 C), temperature source Oral, resp. rate 16, SpO2 99 %. Physical Exam  Constitutional: She is oriented to person, place, and  time. She appears well-developed and well-nourished.  HENT:  Note: Palpable cyst to the right side of the thyroid, mobile with swallowing.  Eyes: Conjunctivae and EOM are normal. Pupils are equal, round, and reactive to light.  Neck: Normal range of motion. Neck supple.  Note: Palpable cyst to the right side of the thyroid, mobile with swallowing.  Cardiovascular: Normal rate and regular rhythm.   Respiratory: Effort normal.  GI: Soft. Bowel sounds are normal.  Musculoskeletal: Normal range of motion.  Neurological: She is alert and oriented to person, place, and time.     Assessment/Plan Impression: Patient is a 50 year old female with multiple thyroid cysts right greater than left however the left side is enlarging. We will proceed with total thyroidectomy All risks and benefits were discussed with the patient to generally include: infection, bleeding, damage to the recurrent laryngeal nerve, damage to superior and inferior parathyroid glands.  Alternatives were offered and described.  All questions were answered and the patient voiced understanding of the procedure and wishes to proceed at this point with total thyroidectomy   Reyes Ivan, MD 02/02/2016, 7:18 AM

## 2016-02-02 NOTE — Discharge Instructions (Signed)
Thyroidectomy, Care After °Refer to this sheet in the next few weeks. These instructions provide you with information about caring for yourself after your procedure. Your health care provider may also give you more specific instructions. Your treatment has been planned according to current medical practices, but problems sometimes occur. Call your health care provider if you have any problems or questions after your procedure. °WHAT TO EXPECT AFTER THE PROCEDURE °After your procedure, it is typical to have: °· Mild pain in the neck or upper body, especially when swallowing. °· A sore throat. °· A weak voice. °HOME CARE INSTRUCTIONS  °· Take medicines only as directed by your health care provider. °· If your entire thyroid gland was removed, you may need to take thyroid hormone medicine from now on. °· Do not take medicines that contain aspirin and ibuprofen until your health care provider says that you can. These medicines can increase your risk of bleeding. °· Some pain medicines cause constipation. Drink enough fluid to keep your urine clear or pale yellow. This can help to prevent constipation. °· Start slowly with eating. You may need to have only liquids and soft foods for a few days or as directed by your health care provider. °· Do not take baths, swim, or use a hot tub until your health care provider approves. °· There are many different ways to close and cover an incision, including stitches (sutures), skin glue, and adhesive strips. Follow your health care provider's instructions for: °¨ Incision care. °¨ Bandage (dressing) changes and removal. °¨ Incision closure removal. °· Resume your usual activities as directed by your health care provider. °· For the first 10 days after the procedure or as instructed by your health care provider: °¨ Do not lift anything heavier than 20 lb (9.1 kg). °¨ Do not jog, swim, or do other strenuous exercises. °¨ Do not play contact sports. °· Keep all follow-up visits as  directed by your health care provider. This is important. °SEEK MEDICAL CARE IF: °· The soreness in your throat gets worse. °· You have increased pain at your incision or incisions. °· You have increased bleeding from an incision. °· Your incision becomes infected. Watch for: °¨ Swelling. °¨ Redness. °¨ Warmth. °¨ Pus. °· You notice a bad smell coming from an incision or dressing. °· You have a fever. °· You feel lightheaded or faint. °· You have numbness, tingling, or muscle spasms in your: °¨ Arms. °¨ Hands. °¨ Feet. °¨ Face. °· You have trouble swallowing. °SEEK IMMEDIATE MEDICAL CARE IF:  °· You develop a rash. °· You have difficulty breathing. °· You hear whistling noises coming from your chest. °· You develop a cough that gets worse. °· Your speech changes, or you have hoarseness that gets worse. °  °This information is not intended to replace advice given to you by your health care provider. Make sure you discuss any questions you have with your health care provider. °  °Document Released: 07/05/2005 Document Revised: 01/06/2015 Document Reviewed: 05/18/2014 °Elsevier Interactive Patient Education ©2016 [employer] ° °

## 2016-02-02 NOTE — Transfer of Care (Signed)
Immediate Anesthesia Transfer of Care Note  Patient: Tanya Armstrong  Procedure(s) Performed: Procedure(s): TOTAL THYROIDECTOMY (N/A)  Patient Location: PACU  Anesthesia Type:General  Level of Consciousness: awake  Airway & Oxygen Therapy: Patient Spontanous Breathing and Patient connected to nasal cannula oxygen  Post-op Assessment: Report given to RN  Post vital signs: stable  Last Vitals:  Filed Vitals:   02/02/16 0706  BP: 131/95  Pulse: 115  Temp: 36.6 C  Resp: 16    Complications: No apparent anesthesia complications

## 2016-02-02 NOTE — Anesthesia Procedure Notes (Signed)
Procedure Name: Intubation Date/Time: 02/02/2016 10:05 AM Performed by: Gaston Islam EVETTE Pre-anesthesia Checklist: Patient identified, Emergency Drugs available, Suction available and Patient being monitored Patient Re-evaluated:Patient Re-evaluated prior to inductionOxygen Delivery Method: Circle system utilized Preoxygenation: Pre-oxygenation with 100% oxygen Intubation Type: IV induction Ventilation: Mask ventilation without difficulty Laryngoscope Size: Mac and 3 Grade View: Grade I Tube type: Oral Number of attempts: 1 Airway Equipment and Method: Stylet Placement Confirmation: ETT inserted through vocal cords under direct vision,  positive ETCO2,  CO2 detector and breath sounds checked- equal and bilateral Secured at: 22 cm Tube secured with: Tape Dental Injury: Teeth and Oropharynx as per pre-operative assessment

## 2016-02-02 NOTE — Anesthesia Postprocedure Evaluation (Signed)
Anesthesia Post Note  Patient: Tanya Armstrong  Procedure(s) Performed: Procedure(s) (LRB): TOTAL THYROIDECTOMY (N/A)  Patient location during evaluation: PACU Anesthesia Type: General Level of consciousness: awake and alert Pain management: pain level controlled Vital Signs Assessment: post-procedure vital signs reviewed and stable Respiratory status: spontaneous breathing, nonlabored ventilation, respiratory function stable and patient connected to nasal cannula oxygen Cardiovascular status: blood pressure returned to baseline and stable Postop Assessment: no signs of nausea or vomiting Anesthetic complications: no    Last Vitals:  Filed Vitals:   02/02/16 1320 02/02/16 1430  BP: 132/87 118/75  Pulse: 78 85  Temp: 36.7 C 36.9 C  Resp: 14 15    Last Pain:  Filed Vitals:   02/02/16 1441  PainSc: 0-No pain                 Catalina Gravel

## 2016-02-03 DIAGNOSIS — E041 Nontoxic single thyroid nodule: Secondary | ICD-10-CM | POA: Diagnosis not present

## 2016-02-03 LAB — COMPREHENSIVE METABOLIC PANEL
ALBUMIN: 4 g/dL (ref 3.5–5.0)
ALK PHOS: 77 U/L (ref 38–126)
ALT: 24 U/L (ref 14–54)
AST: 23 U/L (ref 15–41)
Anion gap: 8 (ref 5–15)
BILIRUBIN TOTAL: 0.6 mg/dL (ref 0.3–1.2)
BUN: 11 mg/dL (ref 6–20)
CALCIUM: 9.1 mg/dL (ref 8.9–10.3)
CO2: 27 mmol/L (ref 22–32)
CREATININE: 0.54 mg/dL (ref 0.44–1.00)
Chloride: 104 mmol/L (ref 101–111)
GFR calc Af Amer: >60 mL/min (ref >60)
GLUCOSE: 124 mg/dL — AB (ref 65–99)
Potassium: 3.9 mmol/L (ref 3.5–5.1)
Sodium: 139 mmol/L (ref 135–145)
TOTAL PROTEIN: 7 g/dL (ref 6.5–8.1)

## 2016-02-03 LAB — MAGNESIUM: MAGNESIUM: 2.2 mg/dL (ref 1.7–2.4)

## 2016-02-03 NOTE — Progress Notes (Signed)
Patient alert and oriented. Interpreter used to give discharge instructions. Patient and family verbalized understanding of instructions and home care. Prescriptions given to patient. All questions answered.

## 2016-02-03 NOTE — Discharge Summary (Signed)
Physician Discharge Summary  Patient ID: Tanya Armstrong MRN: [id number] DOB/AGE: 1966-09-15 50 y.o.  Admit date: 02/02/2016 Discharge date: 02/03/2016  Admission Diagnoses: Thyroid nodule  Discharge Diagnoses:  Active Problems:   S/P total thyroidectomy   Discharged Condition: good  Hospital Course: patient admitted for overnight observation after thyroid surgery  Consults: None  Significant Diagnostic Studies: labs: chemistry  Treatments: IV hydration, analgesia: acetaminophen w/ codeine and surgery: thyroidectomy  Discharge Exam: Blood pressure 113/70, pulse 83, temperature 98.2 F (36.8 C), temperature source Oral, resp. rate 16, height 5\' 2"  (1.575 m), weight 51.37 kg (113 lb 4 oz), last menstrual period 09/02/2015, SpO2 100 %. General appearance: alert and cooperative Neck: mild edema around incision Incision/Wound: clean, dry  Disposition: Home     Medication List    TAKE these medications        acetaminophen 500 MG tablet  Commonly known as:  TYLENOL  Take 1,000 mg by mouth every 6 (six) hours as needed for moderate pain.     calcium-vitamin D 500-200 MG-UNIT tablet  Commonly known as:  OSCAL WITH D  Take 2 tablets by mouth 3 (three) times daily.     glucosamine-chondroitin 500-400 MG tablet  Take 1 tablet by mouth 3 (three) times daily.     levothyroxine 88 MCG tablet  Commonly known as:  SYNTHROID  Take 1 tablet (88 mcg total) by mouth daily before breakfast.     Magnesium Oxide 400 MG Caps  Take 1 capsule (400 mg total) by mouth 2 (two) times daily.     multivitamin with minerals Tabs tablet  Take 2 tablets by mouth daily.     oxyCODONE-acetaminophen 5-325 MG tablet  Commonly known as:  ROXICET  Take 1-2 tablets by mouth every 4 (four) hours as needed.     VITAMIN D PO  Take 1 tablet by mouth daily.           Follow-up Information    Follow up with Reyes Ivan, MD. Schedule an appointment as soon as possible for a visit in 3 weeks.    Specialty:  General Surgery   Why:  For wound re-check   Contact information:   [place] [address] [phone]       Signed: Rosario Adie [id number], [id number] AM

## 2016-04-23 ENCOUNTER — Ambulatory Visit: Payer: BLUE CROSS/BLUE SHIELD | Admitting: Internal Medicine

## 2016-04-23 DIAGNOSIS — Z0289 Encounter for other administrative examinations: Secondary | ICD-10-CM

## 2016-05-09 ENCOUNTER — Telehealth: Payer: Self-pay | Admitting: Internal Medicine

## 2016-05-09 MED ORDER — LEVOTHYROXINE SODIUM 88 MCG PO TABS: ORAL_TABLET | ORAL | Status: DC

## 2016-05-09 NOTE — Telephone Encounter (Signed)
Patient need a refill for Levothyroxine send to  Preferred Pharmacies    [id number] - [place], [place] [id number] (Phone) [phone] (Fax)

## 2016-06-18 ENCOUNTER — Encounter: Payer: Self-pay | Admitting: Internal Medicine

## 2016-07-03 ENCOUNTER — Telehealth: Payer: Self-pay | Admitting: Internal Medicine

## 2016-07-03 MED ORDER — LEVOTHYROXINE SODIUM 88 MCG PO TABS: ORAL_TABLET | ORAL | Status: DC

## 2016-07-03 NOTE — Telephone Encounter (Signed)
Rx submitted for a 30 day supply.

## 2016-07-03 NOTE — Telephone Encounter (Signed)
[facility]ill on [facility] med [facility]

## 2016-07-03 NOTE — Addendum Note (Signed)
Addended by: Verlin Grills T on: 07/03/2016 03:57 PM   Modules accepted: Orders

## 2016-07-16 ENCOUNTER — Other Ambulatory Visit: Payer: Self-pay | Admitting: Internal Medicine

## 2016-08-29 DIAGNOSIS — Z1231 Encounter for screening mammogram for malignant neoplasm of breast: Secondary | ICD-10-CM | POA: Diagnosis not present

## 2016-09-05 ENCOUNTER — Ambulatory Visit (INDEPENDENT_AMBULATORY_CARE_PROVIDER_SITE_OTHER): Payer: BLUE CROSS/BLUE SHIELD | Admitting: Internal Medicine

## 2016-09-05 VITALS — BP 102/78 | HR 97 | Ht 62.0 in | Wt 113.0 lb

## 2016-09-05 DIAGNOSIS — E890 Postprocedural hypothyroidism: Secondary | ICD-10-CM

## 2016-09-05 LAB — T4, FREE: Free T4: 1.47 ng/dL (ref 0.60–1.60)

## 2016-09-05 LAB — TSH: TSH: 0.15 u[IU]/mL — AB (ref 0.35–4.50)

## 2016-09-05 NOTE — Progress Notes (Deleted)
Patient ID: Tanya Armstrong, female   DOB: [birth year], 50 y.o.   MRN: [id number]   HPI  Tanya Armstrong is a 50 y.o.-year-old female, initially referred by Dr. Johnnye Sima, in consultation for MNG and subclinical thyrotoxicosis. Last visit 1 mo ago.   Thyroid U/S (01/30/2015): multiple cysts - unchanged since 2009, when she had FNA of 2 of the dominant cysts: the LML and the RLL >> benign.  Of note, she had a contrasted CT abd and chest on 01/30/2015 for investigation for FUO.  She had the 3 largest cysts drained (11/09/2015). Symptoms were a little better after the drainage, however, ~1 mo ago >> she has again neck compression sxs - she called [place] yesterday and I asked her to come for a new appt today. She cannot sleep b/c neck pressure. 1 week ago, she could not even swallow food >> now better. No hoarseness, odynophagia.  R thyroid cysts:   I reviewed pt's thyroid tests: Lab Results  Component Value Date   TSH 0.44 10/24/2015   TSH 0.53 02/23/2015   TSH 0.262 (L) 02/14/2015   TSH 0.331 (L) 01/30/2015   FREET4 0.81 10/24/2015   FREET4 0.80 02/23/2015   FREET4 1.38 01/30/2015   Pt denies: - fatigue (recent, since she is not sleeping well) - heat intolerance/cold intolerance - tremors - palpitations - anxiety/depression - hyperdefecation/constipation - weight loss/gain - dry skin - hair loss  Pt does not have a FH of thyroid ds. No FH of thyroid cancer. No h/o radiation tx to head or neck.  No seaweed or kelp, no recent contrast studies (last 01/30/2015). No steroid use. No herbal supplements.   I reviewed her chart and she also has a history of HL, FUO (as mentioned above).  ROS: Constitutional: no weight gain/loss, no fatigue, no subjective hyperthermia/hypothermia Eyes: no blurry vision, no xerophthalmia ENT: no sore throat, + see HPU Cardiovascular: no CP/SOB/palpitations/leg swelling Respiratory: no cough/SOB Gastrointestinal: no N/V/D/C Musculoskeletal: no  muscle/joint aches Skin: no rashes Neurological: no tremors/numbness/tingling/dizziness Psychiatric: no depression/anxiety  Past Medical History:  Diagnosis Date  . Abdominal bloating   . Cough   . Dry throat   . FUO (fever of unknown origin)   . H/O one miscarriage   . Headache   . Multiple thyroid nodules   . Thyroid cyst    Past Surgical History:  Procedure Laterality Date  . DILATION AND CURETTAGE OF UTERUS    . THYROIDECTOMY N/A 02/02/2016   Procedure: TOTAL THYROIDECTOMYOTAL THYROIDECTOMY;  Surgeon: Ralene Ok, MD;  Location: [facility];  Service: General;  Laterality: N/A;     History   Social History  . Marital Status: Married    Spouse Name: N/A  . Number of Children: 2   Occupational History  . n/a   Social History Main Topics  . Smoking status: Never Smoker   . Smokeless tobacco: Never Used  . Alcohol Use: No  . Drug Use: No   Current Outpatient Prescriptions on File Prior to Visit  Medication Sig Dispense Refill  . acetaminophen (TYLENOL) 325 MG tablet Take 2 tablets (650 mg total) by mouth every 6 (six) hours as needed for mild pain (or Fever >/= 101). (Patient not taking: Reported on 02/14/2015) 60 tablet 2  . acetaminophen (TYLENOL) 500 MG tablet Take 1,000 mg by mouth every 6 (six) hours as needed for moderate pain.    . calcium-vitamin D (OSCAL WITH D) 500-200 MG-UNIT tablet Take 2 tablets by mouth 3 (three) times daily. 60 tablet  0  . Cholecalciferol (VITAMIN D PO) Take 1 tablet by mouth daily.    . cyclobenzaprine (FLEXERIL) 5 MG tablet TAKE 1 TABLET BY MOUTH TWICE A DAY AS NEEDED FOR SPASM  0  . glucosamine-chondroitin 500-400 MG tablet Take 1 tablet by mouth 3 (three) times daily.    Marland Kitchen levothyroxine (SYNTHROID) 88 MCG tablet Take 1 tablet (88 mcg total) by mouth daily before breakfast. 30 tablet 3  . levothyroxine (SYNTHROID, LEVOTHROID) 88 MCG tablet TAKE 1 TABLET BY MOUTH EVERY DAY BEFORE BREAKFAST **PT NEEDS LAB APPOINTMENT** 30 tablet 0  . Magnesium Oxide 400 MG  CAPS Take 1 capsule (400 mg total) by mouth 2 (two) times daily. 60 capsule 0  . Multiple Vitamin (MULTIVITAMIN WITH MINERALS) TABS tablet Take 2 tablets by mouth daily.     . Multiple Vitamin (MULTIVITAMIN) tablet Take 1 tablet by mouth daily.    Marland Kitchen oxyCODONE-acetaminophen (ROXICET) 5-325 MG tablet Take 1-2 tablets by mouth every 4 (four) hours as needed. 30 tablet 0   No current facility-administered medications on file prior to visit.    No Known Allergies   No family history on file.  PE: There were no vitals taken for this visit. There is no height or weight on file to calculate BMI. Wt Readings from Last 3 Encounters:  02/02/16 113 lb 4 oz (51.4 kg)  01/30/16 113 lb 4 oz (51.4 kg)  11/30/15 113 lb (51.3 kg)   Constitutional: underweight, in NAD Eyes: PERRLA, EOMI, no exophthalmos ENT: moist mucous membranes, +++ protruding thyromegaly - R lobe > L lobe and isthmus, no cervical lymphadenopathy Cardiovascular: tachycardia, RR, No MRG Respiratory: CTA B Gastrointestinal: abdomen soft, NT, ND, BS+ Musculoskeletal: no deformities, strength intact in all 4;  Skin: moist, warm, no rashes Neurological: very fine tremor with outstretched hands, DTR normal in all 4  ASSESSMENT: 1. MNG Thyroid U/S (01/30/2015):  Right thyroid lobe Measurements: 6.5 x 3.2 x 3.6 cm. There are complex, primarily cystic nodules throughout the right lobe.  - In the upper pole, the largest multi cystic nodule measures 2.7 x 1.3 x 1.7 cm. In the right lobe this is the nodule with the most solid component and is in the upper pole, and appears similar to 2009 when accounting for measurement error.  - In the right lower pole there is a cluster of 3 cystic nodules with septations and solid areas, 1.4, 2.1, and 3.1 cm in maximal diameter. Some of the cysts contain echogenic foci with ring down artifact consistent with colloid.    Left thyroid lobe Measurements: 5.5 x 1.3 x 1.6 cm.  - There are 2 measured complex   cystic lesions, 2.1 x 1 x 1 cm in the upper pole and 8 x 5 x 7 mm in the lower pole.  - The upper pole lesion appears mildly larger compared to 2009, now 2.1 cm, previously 1.3 cm. This solid and cystic nodule appears to have been biopsied in 2009.    Isthmus Thickness: 5 mm. There is a cystic 18 mm lesion in the right isthmus  with colloid type internal echoes.    Lymphadenopathy None visualized.  2. Subclinical hyperthyroidism  PLAN: 1. MNG  - Patient presented as an acute appointment for increased neck pressure in the last week. She describes that approximately a week ago, she had such severe pressure in her neck that she could not eat or sleep. She is still not sleeping well, feels a very heavy sensation in her neck, especially on  the right side, but she can eat better now. No shortness of breath. She has to lay on her side at night to be able to add some rest. - I reviewed the images of her thyroid ultrasound from 01/2015 >> The whole thyroid volume is replaced by cystic structures, not much changed from 2009, when 2 of these cysts were Bx'ed (benign). She had aspiration of the 3 largest thyroid cysts (2 in R lobe and 1 in isthmus >> slight relief of neck pressure, but this is now again very uncomfortable - At this visit, she has a very tight right thyroid, without any evidence of improvement after her cyst aspiration. I believe that she needs surgery. I will suggest total thyroidectomy, since she has some increase in size of her left thyroid, also. She agrees with this. - We discussed about replacement with levothyroxine after surgery. She is very taken aback about the fact that she will need to be on this medication for her whole life. She repeatedly tells me that in her country ([place]), people have thyroidectomies and they do not need to take thyroid medication for the rest of her life (which is sad...). I did my best to enforce the absolute need to take thyroid hormone every day. I also  explained how to take it correctly and I gave her the directions in writing. Her sister is a physician in [place] and she will discuss with her about this also. - Referral placed to Dr. Harlow Asa.  - I will see her back approximately 1 month after the surgery. - Until the surgery, try to manage swelling with Tylenol and ibuprofen.  2. Subclinical hyperthyroidism - could have been related to her contrast substance that she received on 01/30/2015 or to an episode of thyroiditis - Last TFTs obtained at last visit were all normal  Patient Instructions  Please take Tylenol and Ibuprofen.  You will be called with the referral to surgery (Dr. Harlow Asa).  I would like to see you back 1 month after the surgery.  After the surgery, you will need to take thyroid hormones EVERY DAY.  Take the thyroid hormone every day, with water, >30 minutes before breakfast, separated by >4 hours from: - acid reflux medications - calcium - iron - multivitamins.  Please move the vitamins at night.   - time spent with the patient: 40 minutes, of which >50% was spent in obtaining information about her symptoms, reviewing her previous labs, imaging tests, and treatments, counseling her about her condition (please see the discussed topics above), and developing a plan to treat; she had a number of questions which I addressed.

## 2016-09-05 NOTE — Patient Instructions (Addendum)
Please stop at the lab.  Please reduce the calcium to once a day for another month, then stop.  Please continue Levothyroxine 88 mcg daily.  Take the thyroid hormone every day, with water, >30 minutes before breakfast, separated by >4 hours from: - acid reflux medications - calcium - iron - multivitamins.  Please return in 4 months.

## 2016-09-05 NOTE — Progress Notes (Signed)
Patient ID: Tanya Armstrong, female   DOB: 03/17/1966, 50 y.o.   MRN: [id number]   HPI  Tanya Armstrong is a 50 y.o.-year-old female, initially referred by Dr. Johnnye Sima, returning for follow-up for history of multicystic thyroid and subclinical thyrotoxicosis, now s/p total thyroidectomy (02/02/2016). Last visit 9 mo ago.   Reviewed and addended history: Thyroid U/S (01/30/2015): multiple cysts - unchanged since 2009, when she had FNA of 2 of the dominant cysts: the LML and the RLL >> benign.  Of note, she had a contrasted CT abd and chest on 01/30/2015 for investigation for FUO.  She had the 3 largest cysts drained (11/09/2015). Symptoms were a little better after the drainage, however, ~1 mo ago >> she has again neck compression sxs - could not sleep b/c neck pressure. Previously, they have mentioned that she could not even swallow food. No hoarseness, odynophagia.  R thyroid cysts:   Since patient had significant discomfort due to her multicystic thyroid, despite previous drainage, I suggested thyroidectomy. Patient had total thyroidectomy by Dr. Harlow Asa on 02/02/2016.  She feels great after the thyroidectomy!  She was subsequently started on levothyroxine 88 g daily, which she continues today.  She takes the levothyroxine: - Fasting - In a.m. - With water - Separated by more than 30 minutes from breakfast - Separated by more than 4 hours from multivitamins - No PPIs, calcium, iron  I reviewed pt's thyroid tests: Lab Results  Component Value Date   TSH 0.44 10/24/2015   TSH 0.53 02/23/2015   TSH 0.262 (L) 02/14/2015   TSH 0.331 (L) 01/30/2015   FREET4 0.81 10/24/2015   FREET4 0.80 02/23/2015   FREET4 1.38 01/30/2015   Pt denies: - fatigue - heat intolerance/cold intolerance - tremors - palpitations - anxiety/depression - hyperdefecation/constipation - weight loss/gain - dry skin - hair loss  Pt does not have a FH of thyroid ds. No FH of thyroid cancer. No h/o  radiation tx to head or neck.  No seaweed or kelp, no recent contrast studies (last 01/30/2015). No steroid use. No herbal supplements.   ROS: Constitutional: no weight gain/loss, no fatigue, no subjective hyperthermia/hypothermia Eyes: no blurry vision, no xerophthalmia ENT: no sore throat, + see HPU Cardiovascular: no CP/SOB/palpitations/leg swelling Respiratory: no cough/SOB Gastrointestinal: no N/V/D/C Musculoskeletal: no muscle/joint aches Skin: no rashes Neurological: no tremors/numbness/tingling/dizziness  I reviewed pt's medications, allergies, PMH, social hx, family hx, and changes were documented in the history of present illness. Otherwise, unchanged from my initial visit note.  Past Medical History:  Diagnosis Date  . Abdominal bloating   . Cough   . Dry throat   . FUO (fever of unknown origin)   . H/O one miscarriage   . Headache   . Multiple thyroid nodules   . Thyroid cyst    Past Surgical History:  Procedure Laterality Date  . DILATION AND CURETTAGE OF UTERUS    . THYROIDECTOMY N/A 02/02/2016   Procedure: TOTAL THYROIDECTOMY;  Surgeon: Ralene Ok, MD;  Location: [facility];  Service: General;  Laterality: N/A;     History   Social History  . Marital Status: Married    Spouse Name: N/A  . Number of Children: 2   Occupational History  . n/a   Social History Main Topics  . Smoking status: Never Smoker   . Smokeless tobacco: Never Used  . Alcohol Use: No  . Drug Use: No   Current Outpatient Prescriptions on File Prior to Visit  Medication Sig Dispense Refill  .  acetaminophen (TYLENOL) 325 MG tablet Take 2 tablets (650 mg total) by mouth every 6 (six) hours as needed for mild pain (or Fever >/= 101). (Patient not taking: Reported on 02/14/2015) 60 tablet 2  . acetaminophen (TYLENOL) 500 MG tablet Take 1,000 mg by mouth every 6 (six) hours as needed for moderate pain.    . calcium-vitamin D (OSCAL WITH D) 500-200 MG-UNIT tablet Take 2 tablets by mouth 3  (three) times daily. 60 tablet 0  . Cholecalciferol (VITAMIN D PO) Take 1 tablet by mouth daily.    . cyclobenzaprine (FLEXERIL) 5 MG tablet TAKE 1 TABLET BY MOUTH TWICE A DAY AS NEEDED FOR SPASM  0  . glucosamine-chondroitin 500-400 MG tablet Take 1 tablet by mouth 3 (three) times daily.    Marland Kitchen levothyroxine (SYNTHROID) 88 MCG tablet Take 1 tablet (88 mcg total) by mouth daily before breakfast. 30 tablet 3  . levothyroxine (SYNTHROID, LEVOTHROID) 88 MCG tablet TAKE 1 TABLET BY MOUTH EVERY DAY BEFORE BREAKFAST **PT NEEDS LAB APPOINTMENT** 30 tablet 0  . Magnesium Oxide 400 MG CAPS Take 1 capsule (400 mg total) by mouth 2 (two) times daily. 60 capsule 0  . Multiple Vitamin (MULTIVITAMIN WITH MINERALS) TABS tablet Take 2 tablets by mouth daily.     . Multiple Vitamin (MULTIVITAMIN) tablet Take 1 tablet by mouth daily.    Marland Kitchen oxyCODONE-acetaminophen (ROXICET) 5-325 MG tablet Take 1-2 tablets by mouth every 4 (four) hours as needed. 30 tablet 0   No current facility-administered medications on file prior to visit.    No Known Allergies   No family history on file.  PE: BP 102/78 (BP Location: Left Arm, Patient Position: Sitting)   Pulse 97   Ht 5\' 2"  (1.575 m)   Wt 113 lb (51.3 kg)   LMP  (LMP Unknown)   SpO2 97%   BMI 20.67 kg/m  Body mass index is 20.67 kg/m. Wt Readings from Last 3 Encounters:  09/05/16 113 lb (51.3 kg)  02/02/16 113 lb 4 oz (51.4 kg)  01/30/16 113 lb 4 oz (51.4 kg)   Constitutional: thin, in NAD Eyes: PERRLA, EOMI, no exophthalmos ENT: moist mucous membranes, Surgical scar healed, no cervical lymphadenopathy Cardiovascular: tachycardia, RR, No MRG Respiratory: CTA B Gastrointestinal: abdomen soft, NT, ND, BS+ Musculoskeletal: no deformities, strength intact in all 4;  Skin: moist, warm, no rashes Neurological: no tremor with outstretched hands, DTR normal in all 4  ASSESSMENT: 1. History of multicystic thyroid Thyroid U/S (01/30/2015):  Right thyroid lobe  Measurements: 6.5 x 3.2 x 3.6 cm. There are complex, primarily cystic nodules throughout the right lobe.  - In the upper pole, the largest multi cystic nodule measures 2.7 x 1.3 x 1.7 cm. In the right lobe this is the nodule with the most solid component and is in the upper pole, and appears similar to 2009 when accounting for measurement error.  - In the right lower pole there is a cluster of 3 cystic nodules with septations and solid areas, 1.4, 2.1, and 3.1 cm in maximal diameter. Some of the cysts contain echogenic foci with ring down artifact consistent with colloid.    Left thyroid lobe Measurements: 5.5 x 1.3 x 1.6 cm.  - There are 2 measured complex  cystic lesions, 2.1 x 1 x 1 cm in the upper pole and 8 x 5 x 7 mm in the lower pole.  - The upper pole lesion appears mildly larger compared to 2009, now 2.1 cm, previously 1.3 cm. This  solid and cystic nodule appears to have been biopsied in 2009.    Isthmus Thickness: 5 mm. There is a cystic 18 mm lesion in the right isthmus  with colloid type internal echoes.    Lymphadenopathy None visualized.  Total thyroidectomy 02/02/2016)  2. Postsurgical hypothyroidism  PLAN: 1. History of multicystic thyroid  - Patient presented initially for increased neck pressure - a very heavy sensation in her neck, especially on the right side, initially with dysphagia, which has improved. A thyroid ultrasound from 01/2015 >> thyroid was replaced by cystic structures, not much changed from 2009, when 2 of these cysts were Bx'ed (benign). She had aspiration of the 3 largest thyroid cysts (2 in R lobe and 1 in isthmus >> slight relief of neck pressure, but this still very uncomfortable >> due to persistent symptoms, I suggested total thyroidectomywhich she had several months ago. She feels much better after the surgery.  2. Postsurgical hypothyroidism - Before her surgery, We discussed about replacement with levothyroxine. She was initially very taken aback  about the fact that she will need to be on this medication for her whole life telling me that in her country ([place]), people have thyroidectomies and they do not need to take thyroid medication for the rest of her life.... I did my best to enforce the absolute need to take thyroid hormone every day.  - I again reiterated the above and also advised her to take her levothyroxine every day, with water, >30 minutes before breakfast, separated by >4 hours from: - acid reflux medications - calcium - iron  - multivitamins. - For now, we will continue Levothyroxine 88 mcg daily. - we will decrease and then stop the calcium (now takes 2 tabs a day) and continue only with her MVI - Will check TFTs today - I will see the patient back in 4 months  Office Visit on 09/05/2016  Component Date Value Ref Range Status  . TSH 09/05/2016 0.15* 0.35 - 4.50 uIU/mL Final  . Free T4 09/05/2016 1.47  0.60 - 1.60 ng/dL Final   TSH is suppressed >> will decrease her LT4 to 75 and recheck her TSH when she comes back in 4 months.  Philemon Kingdom, MD PhD [facility] Endocrinology

## 2016-09-06 ENCOUNTER — Encounter: Payer: Self-pay | Admitting: Internal Medicine

## 2016-09-06 ENCOUNTER — Other Ambulatory Visit: Payer: Self-pay

## 2016-09-06 DIAGNOSIS — E890 Postprocedural hypothyroidism: Secondary | ICD-10-CM | POA: Insufficient documentation

## 2016-09-06 MED ORDER — LEVOTHYROXINE SODIUM 75 MCG PO TABS: 75.0000 ug | ORAL_TABLET | Freq: Every day | ORAL | 3 refills | Status: DC

## 2016-09-06 NOTE — Telephone Encounter (Signed)
Called and had to leave message for patient. Advised that TSH level was abnormal, we were decreasing levothyroxine dose from 88 to 75 which I advised I had sent into pharmacy. Advised patient we would recheck labs when she comes back in 4 months. Gave call back number if questions.

## 2016-09-12 DIAGNOSIS — Z1151 Encounter for screening for human papillomavirus (HPV): Secondary | ICD-10-CM | POA: Diagnosis not present

## 2016-09-12 DIAGNOSIS — Z01419 Encounter for gynecological examination (general) (routine) without abnormal findings: Secondary | ICD-10-CM | POA: Diagnosis not present

## 2016-09-12 DIAGNOSIS — Z6820 Body mass index (BMI) 20.0-20.9, adult: Secondary | ICD-10-CM | POA: Diagnosis not present

## 2016-10-03 ENCOUNTER — Ambulatory Visit (INDEPENDENT_AMBULATORY_CARE_PROVIDER_SITE_OTHER): Payer: BLUE CROSS/BLUE SHIELD

## 2016-10-03 ENCOUNTER — Encounter: Payer: Self-pay | Admitting: Family Medicine

## 2016-10-03 ENCOUNTER — Ambulatory Visit (INDEPENDENT_AMBULATORY_CARE_PROVIDER_SITE_OTHER): Payer: BLUE CROSS/BLUE SHIELD | Admitting: Family Medicine

## 2016-10-03 VITALS — BP 142/89 | HR 94 | Temp 97.9°F | Resp 16 | Ht 62.0 in | Wt 112.0 lb

## 2016-10-03 DIAGNOSIS — S43491A Other sprain of right shoulder joint, initial encounter: Secondary | ICD-10-CM

## 2016-10-03 DIAGNOSIS — R590 Localized enlarged lymph nodes: Secondary | ICD-10-CM

## 2016-10-03 DIAGNOSIS — M25511 Pain in right shoulder: Secondary | ICD-10-CM

## 2016-10-03 DIAGNOSIS — R599 Enlarged lymph nodes, unspecified: Secondary | ICD-10-CM | POA: Diagnosis not present

## 2016-10-03 LAB — POCT URINALYSIS DIP (MANUAL ENTRY)
BILIRUBIN UA: NEGATIVE
BILIRUBIN UA: NEGATIVE
GLUCOSE UA: NEGATIVE
Nitrite, UA: NEGATIVE
PH UA: 7.5
Protein Ur, POC: NEGATIVE
Spec Grav, UA: 1.015
Urobilinogen, UA: 0.2

## 2016-10-03 LAB — POCT CBC
GRANULOCYTE PERCENT: 52.3 % (ref 37–80)
HEMATOCRIT: 44 % (ref 37.7–47.9)
HEMOGLOBIN: 15.5 g/dL (ref 12.2–16.2)
Lymph, poc: 3.4 (ref 0.6–3.4)
MCH: 30.8 pg (ref 27–31.2)
MCHC: 35.2 g/dL (ref 31.8–35.4)
MCV: 87.3 fL (ref 80–97)
MID (cbc): 0.4 (ref 0–0.9)
MPV: 7.7 fL (ref 0–99.8)
POC GRANULOCYTE: 4.2 (ref 2–6.9)
POC LYMPH PERCENT: 42.6 %L (ref 10–50)
POC MID %: 5.1 % (ref 0–12)
Platelet Count, POC: 247 10*3/uL (ref 142–424)
RBC: 5.04 M/uL (ref 4.04–5.48)
RDW, POC: 13.2 %
WBC: 8 10*3/uL (ref 4.6–10.2)

## 2016-10-03 LAB — POCT URINE PREGNANCY: Preg Test, Ur: NEGATIVE

## 2016-10-03 MED ORDER — MELOXICAM 15 MG PO TABS: 15.0000 mg | ORAL_TABLET | Freq: Every day | ORAL | 0 refills | Status: DC

## 2016-10-03 NOTE — Patient Instructions (Addendum)
IF you received an x-ray today, you will receive an invoice from [facility]. Please contact [facility] at [phone] with questions or concerns regarding your invoice.   IF you received labwork today, you will receive an invoice from [employer]. Please contact [facility] at [phone] with questions or concerns regarding your invoice.   Our billing staff will not be able to assist you with questions regarding bills from these companies.  You will be contacted with the lab results as soon as they are available. The fastest way to get your results is to activate your My Chart account. Instructions are located on the last page of this paperwork. If you have not heard from [place] regarding the results in 2 weeks, please contact this office.      Bong gn c? (Cervical Sprain) Bong gn c? l t?n th??ng ? c? khi cc m x? kh?e (dy ch?ng) n?i cc x??ng c? c?a qu? v? b? gin ho?c rch. Bong gn c? c th? c m?c ?? t? nh? ??n n?ng. Bong gn c? n?ng c th? khi?n cho ??t s?ng c? khng ?n ??nh. ?i?u ny c th? d?n ??n t?n th??ng t?y s?ng v c th? gy ra cc v?n ?? nghim tr?ng ??i v?i h? th?n kinh. Th?i gian ?? tnh tr?ng bong gn c? ?? h?n ty thu?c vo nguyn nhn v m?c ?? t?n th??ng. H?u h?t cc tr??ng h?p bong gn c? kh?i trong 1 ??n 3 tu?n. NGUYN NHN  Bong gn c? n?ng c th? do:   Cc t?n th??ng do ch?i mn th? thao ti?p xc (ch?ng h?n nh? bng ?, bng b?u d?c, v?t, khc cn c?u, ?ua  t, th? d?c, l?n, v thu?t, ho?c quy?n Anh).  Cc v? va ch?m xe  t.  Cc t?n th??ng do t?ng ho?c gi?m t?c ?? ??t ng?t. ?y l m?t t?n th??ng do chuy?n ??ng ??t ng?t c?a ??u v c? v? pha tr??c v pha sau.  T ng. Bong gn c? m?c ?? nh? c th? do:   Trong t? th? b?t ti?n, ch?ng h?n nh? k?p ?i?n tho?i gi?a tai v vai c?a qu v?.  Ng?i trn gh? khng c h? tr? thch h?p.  Lm vi?c t?i m?t tr?m my tnh c thi?t k? km ch?t l??ng.  Nhn ln ho?c nhn  xu?ng trong th?i gian di. TRI?U CH?NG   ?au, ?au nh?c, c?ng ho?c c?m gic ?au rt b?ng ? pha tr??c, pha sau ho?c hai bn c?. C?m gic kh ch?u ny c th? ti?n tri?n ngay l?p t?c sau khi b? t?n th??ng ho?c c th? ti?n tri?n ch?m, 24 gi? tr? ln sau khi b? t?n th??ng.  ?au ho?c c?m gic c?m ?au ? ph?n gi?a gy.  ?au vai ho?c l?ng trn.  Kh? n?ng c? ??ng c? b? h?n ch?.  ?au ??u.  Chng m?t.  Y?u, t ho?c ?au bu?t ? bn tay ho?c cnh tay.  Co th?t c?.  Kh nu?t ho?c kh nhai.  C?m gic ?au v s?ng ? c?. CH?N ?ON  Chuyn gia ch?m [place] s?c kh?e ph?n l?n c th? ch?n ?on bong gn c? b?ng cch khai thc ti?n s? c?a qu v? v khm th?c th?Paulino Rily gia ch?m [place] s?c kh?e s? h?i v? cc t?n th??ng ? c? tr??c ?y v b?t k? v?n ?? no ? c? ? bi?t, ch?ng h?n nh? vim kh?p ? c?. C th? ch?p X-quang ?? xc ??nh xem c b?t k? v?n ??  no khc khng, ch?ng h?n nh? v?i x??ng c?. C?ng c th? c?n lm cc ki?m tra khc nh? ch?p CT ho?c MRI.  ?I?U TR?  ?i?u tr? ph? thu?c vo m?c ?? n?ng c?a bong gn c?. Bong gn m?c ?? nh? c th? ???c ?i?u tr? b?ng cch ngh? ng?i, gi? c? ? t? th? (c? ??nh) v thu?c gi?m ?au. Bong gn c? m?c ?? n?ng c?n ???c c? ??nh ngay l?p t?c. Vi?c ?i?u tr? ???c ti?p t?c ?? gip lm gi?m ?au, co th?t c? cc tri?u ch?ng khc v c th? bao g?m:  Thu?c, ch?ng h?n nh? thu?c gi?m ?au, thu?c t ho?c thu?c lm gin c?.  V?t l tr? li?u. Tr? li?u ny c th? bao g?m cc bi t?p ko dn, bi t?p t?ng c??ng s?c b?n v t?p theo t? th?. Bi t?p v c?i thi?n t? th? c th? gip ?n ??nh c?, t?ng c??ng c? b?p v gip ng?n ch?n cc tri?u ch?ng quay tr? l?i. H??NG D?N CH?M [place] T?I NH   Ch??m ? l?nh ln vng b? th??ng.  Cho ? l?nh vo ti nh?a.  ?? kh?n t?m vo gi?a da v ti.  ?? ? l?nh trong kho?ng 15-20 pht, 3-4 l?n m?i ngy.  N?u t?n th??ng n?ng, qu v? c th? ???c ?eo m?t vng n?p c? Vng n?p c? l m?t vng g?m hai m?nh ???c thi?t k? ?? gi? cho c? qu v? khng c? ??ng trong qu trnh h?i  ph?c.  Khng tho vng n?p c?, tr? khi ???c chuyn gia ch?m [place] s?c kh?e h??ng d?n.  N?u tc qu v? di, hy cho tc ra bn ngoi vng n?p c?.  H?i chuyn gia ch?m [place] s?c kh?e tr??c khi ?i?u ch?nh b?t k? ph?n no c?a vng n?p c?. C th? c?n ?i?u ch?nh m?t cht cc ph?n theo th?i gian ?? c c?m gic d? ch?u h?n v lm gi?m chn p ln c?m ho?c ln pha sau ??u.  N?uqu v? ???c php tho vng n?p ra ?? v? sinh ho?c t?m r?a, hy lm theo ch? d?n c?a chuyn gia ch?m [place] s?c kh?e v? cch th?c hi?n sao cho an ton.  Gi? cho vng n?p c? s?ch s? b?ng cch lau r?a b?ng x phng nh? v?i n??c v ph?i kh hon ton. N?u vng n?p c? ??a cho b?n dng c nh?ng mi?ng ??m c th? tho r?i, hy tho cc mi?ng ??m ? ra sau m?i 1 - 2 ngy m?t l?n v r?a cc mi?ng ??m b?ng tay v?i x phng v n??c. ?? cho cc mi?ng ??m t? kh. Cc mi?ng ??m ph?i kh hon ton tr??c khi qu v? l?p chng vo vng n?p c?.  N?u qu v? ???c php tho vng n?p c? ra ?? lm v? sinh v t?m r?a, hy r?a v lm kh ph?n da c? c?a qu v?. Ki?m tra da xem c b? kch ?ng ho?c l? lot khng. N?u c kch ?ng ho?c l? lot, hy ni cho chuyn gia ch?m [place] s?c kh?e bi?t.  Khng li xe trong khi ?eo vng n?p c?.  Ch? s? d?ng thu?c khng c?n k ??n ho?c thu?c c?n k ??n ?? gi?m ?au, gi?m c?m gic kh ch?u ho?c h? s?t theo ch? d?n c?a chuyn gia ch?m [place] s?c kh?e c?a qu v?.  Tun th? m?i cu?c h?n khm l?i theo ch? d?n c?a chuyn gia ch?m  s?c kh?e.  Tun th? m?i cu?c h?n lm v?t l tr? li?u theo ch?  d?n c?a chuyn gia ch?m [place] s?c kh?e.  Th?c hi?n b?t c? ?i?u ch?nh no c?n thi?t ? ch? lm vi?c c?a qu v? ?? c t? th? ?ng.  Trnh nh?ng t? th? v ho?t ??ng lm cho cc tri?u ch?ng c?a qu v? t?i t? h?n.  Kh?i ??ng v ko dn tr??c khi ho?t ??ng ?? gip ng?n ng?a cc v?n ?? x?y ra. ?I KHM N?U:   C?n ?au c?a qu v? khng ki?m sot ???c b?ng thu?c.  Qu v? khng th? gi?m l??ng thu?c gi?m ?au theo th?i gian theo k? ho?ch.  M?c ?? ho?t ??ng  c?a qu v? khng c?i thi?n nh? mong ??i. NGAY L?P T?C ?I KHM N?U:   Qu v? b? ch?y mu ? b?t c? ?u.  Qu v? c c?m gic bu?n nn.  Qu v? c cc d?u hi?u b? m?t ph?n ?ng d? ?ng v?i thu?c.  Tri?u ch?ng c?a qu v? n?ng h?n.  Qu v? m?i c nh?ng tri?u ch?ng khng r nguyn nhn.  Qu v? b? t, ?au nhi, y?u ho?c li?t b?t c? ph?n no c?a c? th?. ??M B?O QY V?:   Hi?u cc h??ng d?n ny.  S? theo di tnh tr?ng c?a mnh.  S? yu c?u tr? gip ngay l?p t?c n?u b?n c?m th?y khng kh?e ho?c th?y tr?m tr?ng h?n.   Thng tin ny khng nh?m m?c ?ch thay th? cho l?i khuyn m chuyn gia ch?m [place] s?c kh?e ni v?i qu v?. Hy b?o ??m qu v? ph?i th?o lu?n b?t k? v?n ?? g m qu v? c v?i chuyn gia ch?m  s?c kh?e c?a qu v?.   Document Released: 12/05/2011 Document Revised: 10/06/2013 Elsevier Interactive Patient Education [employer].

## 2016-10-03 NOTE — Progress Notes (Signed)
Subjective:    Patient ID: Tanya Armstrong, female    DOB: March 04, 1966, 50 y.o.   MRN: [id number]  10/03/2016  Shoulder Pain (right, x 1 week )   HPI This 50 y.o. female presents for evaluation of R shoulder pain. Onset in past week.  Works at [employer]; lifts a lot at work; +painful range of motion of R shoulder.  Mild neck pain especially in anterior region.  Swollen area above collar bone.  No trauma.  Denies fever/chills/sweats; denies headache. Denies recent sore throat, rhinorrhea, nasal congestion, or cough.  Non-smoker. No recent travel.  No exposure to tuberculosis.  No night sweats.  No unintentional weight loss.  No n/t/w in upper extremities.   S/p thyroidectomy in past year; no malignancy; +cystic multinodular thyroid.  S/p CT chest/abdomen/pelvis 01/30/2015 for fever of unknown origin; CT chest negative other than for 4 mm pulmonary nodules.  Mammogram 08/2014 WNL.  AFB culture blood in 02/14/15 negative.  Review of Systems  Constitutional: Negative for chills, diaphoresis, fatigue, fever and unexpected weight change.  HENT: Negative for congestion, ear pain, postnasal drip, rhinorrhea, sinus pressure, sore throat and trouble swallowing.   Respiratory: Negative for cough and shortness of breath.   Cardiovascular: Negative for chest pain, palpitations and leg swelling.  Gastrointestinal: Negative for abdominal pain, constipation, diarrhea, nausea and vomiting.  Musculoskeletal: Positive for arthralgias.  Neurological: Negative for numbness.  Hematological: Positive for adenopathy. Does not bruise/bleed easily.    Past Medical History:  Diagnosis Date  . Abdominal bloating   . Cough   . Dry throat   . FUO (fever of unknown origin)   . H/O one miscarriage   . Headache   . Multiple thyroid nodules   . Thyroid cyst    Past Surgical History:  Procedure Laterality Date  . DILATION AND CURETTAGE OF UTERUS    . THYROIDECTOMY N/A 02/02/2016   Procedure: TOTAL THYROIDECTOMY;   Surgeon: Ralene Ok, MD;  Location: [facility];  Service: General;  Laterality: N/A;   No Known Allergies Current Outpatient Prescriptions  Medication Sig Dispense Refill  . calcium-vitamin D (OSCAL WITH D) 500-200 MG-UNIT tablet Take 2 tablets by mouth 3 (three) times daily. 60 tablet 0  . Cholecalciferol (VITAMIN D PO) Take 1 tablet by mouth daily.    Marland Kitchen glucosamine-chondroitin 500-400 MG tablet Take 1 tablet by mouth 3 (three) times daily.    Marland Kitchen levothyroxine (SYNTHROID, LEVOTHROID) 75 MCG tablet Take 1 tablet (75 mcg total) by mouth daily. 90 tablet 3  . Multiple Vitamin (MULTIVITAMIN WITH MINERALS) TABS tablet Take 2 tablets by mouth daily.     . meloxicam (MOBIC) 15 MG tablet Take 1 tablet (15 mg total) by mouth daily. 30 tablet 0   No current facility-administered medications for this visit.    Social History   Social History  . Marital status: Married    Spouse name: N/A  . Number of children: N/A  . Years of education: N/A   Occupational History  . Not on file.   Social History Main Topics  . Smoking status: Never Smoker  . Smokeless tobacco: Never Used  . Alcohol use No  . Drug use: No  . Sexual activity: No   Other Topics Concern  . Not on file   Social History Narrative   ** Merged History Encounter **       No family history on file.     Objective:    BP (!) 142/89 (BP Location: Right Arm,  Patient Position: Sitting, Cuff Size: Normal)   Pulse 94   Temp 97.9 F (36.6 C) (Oral)   Resp 16   Ht 5\' 2"  (1.575 m)   Wt 112 lb (50.8 kg)   LMP  (LMP Unknown)   SpO2 97%   BMI 20.49 kg/m  Physical Exam  Constitutional: She is oriented to person, place, and time. She appears well-developed and well-nourished. No distress.  HENT:  Head: Normocephalic and atraumatic.  Eyes: Conjunctivae are normal. Pupils are equal, round, and reactive to light.  Neck: Normal range of motion. Neck supple.  Cardiovascular: Normal rate, regular rhythm and normal heart sounds.   Exam reveals no gallop and no friction rub.   No murmur heard. Pulmonary/Chest: Effort normal and breath sounds normal. She has no wheezes. She has no rales.  Musculoskeletal:       Right shoulder: She exhibits pain and spasm. She exhibits normal range of motion, no tenderness, normal pulse and normal strength.       Cervical back: She exhibits normal range of motion, no tenderness, no bony tenderness, no swelling, no pain and no spasm.  Lymphadenopathy:       Head (right side): No submental, no submandibular, no tonsillar, no preauricular, no posterior auricular and no occipital adenopathy present.       Head (left side): No submental, no submandibular, no tonsillar, no preauricular, no posterior auricular and no occipital adenopathy present.       Right cervical: No superficial cervical, no deep cervical and no posterior cervical adenopathy present.      Left cervical: No superficial cervical, no deep cervical and no posterior cervical adenopathy present.       Right: Supraclavicular adenopathy present. No epitrochlear adenopathy present.       Left: No supraclavicular and no epitrochlear adenopathy present.  Neurological: She is alert and oriented to person, place, and time.  Skin: She is not diaphoretic.  Psychiatric: She has a normal mood and affect. Her behavior is normal.  Nursing note and vitals reviewed.       Assessment & Plan:   1. Lymphadenopathy, supraclavicular   2. Pain in joint of right shoulder   3. Sprain of other part of right shoulder region, initial encounter    -New. -obtain labs including Tb Gold, HIV, CBC.  If negative, refer for CT chest to rule out malignancy. -treat R shoulder sprain with Meloxicam and home exercise program. -close follow-up.   Orders Placed This Encounter  Procedures  . DG Shoulder Right    Standing Status:   Future    Number of Occurrences:   1    Standing Expiration Date:   10/03/2017    Order Specific Question:   Reason for Exam  (SYMPTOM  OR DIAGNOSIS REQUIRED)    Answer:   R posterior shoulder pain; r supraclavicular lymphadenopathy; vietnemese    Order Specific Question:   Is the patient pregnant?    Answer:   No    Order Specific Question:   Preferred imaging location?    Answer:   External  . DG Chest 2 View    Standing Status:   Future    Number of Occurrences:   1    Standing Expiration Date:   10/03/2017    Order Specific Question:   Reason for Exam (SYMPTOM  OR DIAGNOSIS REQUIRED)    Answer:   R posterior shoulder pain; r supraclavicular lymphadenopathy; vietnemese    Order Specific Question:   Is the patient pregnant?  Answer:   No    Order Specific Question:   Preferred imaging location?    Answer:   External  . Quantiferon tb gold assay  . HIV antibody  . POCT urinalysis dipstick  . POCT urine pregnancy  . POCT CBC   Meds ordered this encounter  Medications  . meloxicam (MOBIC) 15 MG tablet    Sig: Take 1 tablet (15 mg total) by mouth daily.    Dispense:  30 tablet    Refill:  0    Return in about 7 days (around 10/10/2016) for recheck with Dr. Tamala Julian.   Norwood Levo, M.D. [facility] [address] [place], [place]  [address] [phone] phone [phone] fax

## 2016-10-04 LAB — HIV ANTIBODY (ROUTINE TESTING W REFLEX): HIV 1&2 Ab, 4th Generation: NONREACTIVE

## 2016-10-05 LAB — QUANTIFERON TB GOLD ASSAY (BLOOD)
INTERFERON GAMMA RELEASE ASSAY: NEGATIVE
Mitogen-Nil: >10 IU/mL
QUANTIFERON NIL VALUE: 0.02 [IU]/mL
Quantiferon Tb Ag Minus Nil Value: 0 IU/mL

## 2016-10-11 ENCOUNTER — Telehealth: Payer: Self-pay | Admitting: Family Medicine

## 2016-10-11 ENCOUNTER — Ambulatory Visit: Payer: BLUE CROSS/BLUE SHIELD

## 2016-10-11 DIAGNOSIS — R590 Localized enlarged lymph nodes: Secondary | ICD-10-CM

## 2016-10-11 NOTE — Telephone Encounter (Signed)
° ° °

## 2016-10-12 NOTE — Telephone Encounter (Signed)
Call --- lab work is normal; no evidence of tuberculosis or HIV.  I recommend scheduling a CT of chest to determine source of swollen lymph node.  I will schedule CT chest for patient.  Pt was to follow-up with me today; can she come in for an office visit?

## 2016-10-12 NOTE — Telephone Encounter (Signed)
Left message to return call 

## 2016-10-17 ENCOUNTER — Ambulatory Visit (INDEPENDENT_AMBULATORY_CARE_PROVIDER_SITE_OTHER): Payer: BLUE CROSS/BLUE SHIELD | Admitting: Family Medicine

## 2016-10-17 VITALS — BP 124/84 | HR 95 | Temp 98.1°F | Resp 17 | Ht 62.0 in | Wt 116.0 lb

## 2016-10-17 DIAGNOSIS — S46911D Strain of unspecified muscle, fascia and tendon at shoulder and upper arm level, right arm, subsequent encounter: Secondary | ICD-10-CM | POA: Diagnosis not present

## 2016-10-17 DIAGNOSIS — R590 Localized enlarged lymph nodes: Secondary | ICD-10-CM | POA: Diagnosis not present

## 2016-10-17 DIAGNOSIS — R3129 Other microscopic hematuria: Secondary | ICD-10-CM

## 2016-10-17 LAB — POCT URINALYSIS DIP (MANUAL ENTRY)
BILIRUBIN UA: NEGATIVE
Bilirubin, UA: NEGATIVE
Glucose, UA: NEGATIVE
LEUKOCYTES UA: NEGATIVE
NITRITE UA: NEGATIVE
PH UA: 7
PROTEIN UA: NEGATIVE
Spec Grav, UA: 1.015
Urobilinogen, UA: 0.2

## 2016-10-17 LAB — POC MICROSCOPIC URINALYSIS (UMFC): Mucus: ABSENT

## 2016-10-17 NOTE — Patient Instructions (Signed)
     IF you received an x-ray today, you will receive an invoice from [facility]. Please contact [facility] at [phone] with questions or concerns regarding your invoice.   IF you received labwork today, you will receive an invoice from [facility]. Please contact [facility] at [phone] with questions or concerns regarding your invoice.   Our billing staff will not be able to assist you with questions regarding bills from these companies.  You will be contacted with the lab results as soon as they are available. The fastest way to get your results is to activate your My Chart account. Instructions are located on the last page of this paperwork. If you have not heard from us regarding the results in 2 weeks, please contact this office.      

## 2016-10-17 NOTE — Progress Notes (Signed)
Subjective:    Patient ID: Tanya Armstrong, female    DOB: 1966-07-26, 50 y.o.   MRN: [id number]  10/17/2016  Follow-up (right shoulder pain)   HPI This 50 y.o. female presents for evaluation of R shoulder pain and R supraclavicular LAD.  S/p labs at last visit including TB gold, HIV, urinalysis, CBC.  All studies negative other than blood in urine.  S/p CXR negative; R shoulder films negative.    Patient reports that shoulder pain has improved; will now have intermittent shoulder pain while at work only; when rests shoulder, pain improves.  Feeling better with medication.  After work, R shoulder still present.  Reduced work load.  No nighttime awakening due to pain.    R supraclavicular swelling has also improved and now much smaller.  No pain at lymph node region. Before swollen and painful; no longer pain.  No fever; no sweats.  No cough.  No SOB.   Small amount of blood present in urine at previous visit.  Denies gross hematuria, dysuria, urgency, frequency, nocturia.  [place] vietnemese interpreter [phone]  Review of Systems  Constitutional: Negative for chills, diaphoresis, fatigue and fever.  Eyes: Negative for visual disturbance.  Respiratory: Negative for cough and shortness of breath.   Cardiovascular: Negative for chest pain, palpitations and leg swelling.  Gastrointestinal: Negative for abdominal pain, constipation, diarrhea, nausea and vomiting.  Endocrine: Negative for cold intolerance, heat intolerance, polydipsia, polyphagia and polyuria.  Genitourinary: Negative for dysuria, flank pain, frequency, genital sores, hematuria, menstrual problem, urgency and vaginal bleeding.  Musculoskeletal: Positive for arthralgias, neck pain and neck stiffness.  Neurological: Negative for dizziness, tremors, seizures, syncope, facial asymmetry, speech difficulty, weakness, light-headedness, numbness and headaches.  Hematological: Negative for adenopathy. Does not bruise/bleed easily.     Past Medical History:  Diagnosis Date  . Abdominal bloating   . Cough   . Dry throat   . FUO (fever of unknown origin)   . H/O one miscarriage   . Headache   . Multiple thyroid nodules   . Thyroid cyst    Past Surgical History:  Procedure Laterality Date  . DILATION AND CURETTAGE OF UTERUS    . THYROIDECTOMY N/A 02/02/2016   Procedure: TOTAL THYROIDECTOMY;  Surgeon: Ralene Ok, MD;  Location: [facility];  Service: General;  Laterality: N/A;   No Known Allergies Current Outpatient Prescriptions  Medication Sig Dispense Refill  . calcium-vitamin D (OSCAL WITH D) 500-200 MG-UNIT tablet Take 2 tablets by mouth 3 (three) times daily. 60 tablet 0  . Cholecalciferol (VITAMIN D PO) Take 1 tablet by mouth daily.    Marland Kitchen glucosamine-chondroitin 500-400 MG tablet Take 1 tablet by mouth 3 (three) times daily.    Marland Kitchen levothyroxine (SYNTHROID, LEVOTHROID) 75 MCG tablet Take 1 tablet (75 mcg total) by mouth daily. 90 tablet 3  . meloxicam (MOBIC) 15 MG tablet Take 1 tablet (15 mg total) by mouth daily. 30 tablet 0  . Multiple Vitamin (MULTIVITAMIN WITH MINERALS) TABS tablet Take 2 tablets by mouth daily.      No current facility-administered medications for this visit.    Social History   Social History  . Marital status: Married    Spouse name: N/A  . Number of children: N/A  . Years of education: N/A   Occupational History  . Not on file.   Social History Main Topics  . Smoking status: Never Smoker  . Smokeless tobacco: Never Used  . Alcohol use No  . Drug use: No  .  Sexual activity: No   Other Topics Concern  . Not on file   Social History Narrative   ** Merged History Encounter **       No family history on file.     Objective:    BP 124/84 (BP Location: Right Arm, Patient Position: Sitting, Cuff Size: Normal)   Pulse 95   Temp 98.1 F (36.7 C) (Oral)   Resp 17   Ht 5\' 2"  (1.575 m)   Wt 116 lb (52.6 kg)   LMP  (LMP Unknown)   SpO2 98%   BMI 21.22 kg/m   Physical Exam  Constitutional: She is oriented to person, place, and time. She appears well-developed and well-nourished. No distress.  HENT:  Head: Normocephalic and atraumatic.  Right Ear: External ear normal.  Left Ear: External ear normal.  Nose: Nose normal.  Mouth/Throat: Oropharynx is clear and moist.  Eyes: Conjunctivae and EOM are normal. Pupils are equal, round, and reactive to light.  Neck: Normal range of motion. Neck supple. Carotid bruit is not present. No thyromegaly present.  Cardiovascular: Normal rate, regular rhythm, normal heart sounds and intact distal pulses.  Exam reveals no gallop and no friction rub.   No murmur heard. Pulmonary/Chest: Effort normal and breath sounds normal. She has no wheezes. She has no rales.  Abdominal: Soft. Bowel sounds are normal. She exhibits no distension and no mass. There is no tenderness. There is no rebound and no guarding.  Musculoskeletal:       Right shoulder: Normal. She exhibits normal range of motion, no tenderness, no bony tenderness, no pain, no spasm, normal pulse and normal strength.       Cervical back: She exhibits spasm. She exhibits normal range of motion, no tenderness, no bony tenderness, no pain and normal pulse.  Lymphadenopathy:       Head (right side): No submental, no submandibular, no tonsillar, no preauricular, no posterior auricular and no occipital adenopathy present.       Head (left side): No submental, no submandibular, no tonsillar, no preauricular, no posterior auricular and no occipital adenopathy present.    She has no cervical adenopathy.       Right: No supraclavicular adenopathy present.       Left: No supraclavicular adenopathy present.  Near resolution of R supraclavicular LAD; node still palpable but much decreased in size.  Neurological: She is alert and oriented to person, place, and time. No cranial nerve deficit.  Skin: Skin is warm and dry. No rash noted. She is not diaphoretic. No erythema.  No pallor.  Psychiatric: She has a normal mood and affect. Her behavior is normal.   Results for orders placed or performed in visit on 10/17/16  POCT urinalysis dipstick  Result Value Ref Range   Color, UA yellow yellow   Clarity, UA clear clear   Glucose, UA negative negative   Bilirubin, UA negative negative   Ketones, POC UA negative negative   Spec Grav, UA 1.015    Blood, UA moderate (A) negative   pH, UA 7.0    Protein Ur, POC negative negative   Urobilinogen, UA 0.2    Nitrite, UA Negative Negative   Leukocytes, UA Negative Negative  POCT Microscopic Urinalysis ([facility])  Result Value Ref Range   WBC,UR,HPF,POC None None WBC/hpf   RBC,UR,HPF,POC None None RBC/hpf   Bacteria None None, Too numerous to count   Mucus Absent Absent   Epithelial Cells, UR Per Microscopy None None, Too numerous to count cells/hpf  Assessment & Plan:   1. Supraclavicular adenopathy   2. Hematuria, microscopic   3. Right shoulder strain, subsequent encounter    -improved yet will proceed with CT chest with contrast to evaluate further. -repeat u/a micro negative for hematuria. -R shoulder sprain much improved today with full range of motion; continue supportive care with rest, home exercise program.   Orders Placed This Encounter  Procedures  . CT Chest W Contrast    Standing Status:   Future    Standing Expiration Date:   12/17/2017    Scheduling Instructions:     Next Thursday is patient's day off.    Order Specific Question:   If indicated for the ordered procedure, I authorize the administration of contrast media per Radiology protocol    Answer:   Yes    Order Specific Question:   Reason for Exam (SYMPTOM  OR DIAGNOSIS REQUIRED)    Answer:   R supraclavicular lymphadenopathy    Order Specific Question:   Is patient pregnant?    Answer:   No    Order Specific Question:   Preferred imaging location?    Answer:   [facility]  . POCT urinalysis dipstick  . POCT  Microscopic Urinalysis ([facility])   No orders of the defined types were placed in this encounter.   No Follow-up on file.   [place] Elayne Guerin, M.D. [facility] [address] [place], [place]  [address] [phone] phone [phone] fax

## 2016-10-29 ENCOUNTER — Encounter: Payer: Self-pay | Admitting: Family Medicine

## 2016-10-29 ENCOUNTER — Ambulatory Visit (INDEPENDENT_AMBULATORY_CARE_PROVIDER_SITE_OTHER): Payer: BLUE CROSS/BLUE SHIELD | Admitting: Family Medicine

## 2016-10-29 VITALS — BP 100/80 | HR 93 | Temp 98.4°F | Resp 16 | Ht 62.5 in | Wt 113.0 lb

## 2016-10-29 DIAGNOSIS — F5104 Psychophysiologic insomnia: Secondary | ICD-10-CM

## 2016-10-29 DIAGNOSIS — R590 Localized enlarged lymph nodes: Secondary | ICD-10-CM

## 2016-10-29 DIAGNOSIS — Z23 Encounter for immunization: Secondary | ICD-10-CM | POA: Diagnosis not present

## 2016-10-29 DIAGNOSIS — S43491A Other sprain of right shoulder joint, initial encounter: Secondary | ICD-10-CM | POA: Diagnosis not present

## 2016-10-29 NOTE — Patient Instructions (Addendum)
  [facility] [place] Your appointment is Thursday October 31, 2016 at 1:45 pm for the CT scan.  DO NOT EAT 4 HOURS BEFORE THE APPOINTMENT. Your last meal the day before appointment will be at 9:30 PM, you can take your medication with water only.    IF you received an x-ray today, you will receive an invoice from [facility]. Please contact [facility] at [phone] with questions or concerns regarding your invoice.   IF you received labwork today, you will receive an invoice from [employer]. Please contact [facility] at [phone] with questions or concerns regarding your invoice.   Our billing staff will not be able to assist you with questions regarding bills from these companies.  You will be contacted with the lab results as soon as they are available. The fastest way to get your results is to activate your My Chart account. Instructions are located on the last page of this paperwork. If you have not heard from [place] regarding the results in 2 weeks, please contact this office.

## 2016-10-29 NOTE — Progress Notes (Signed)
Subjective:    Patient ID: Tanya Armstrong, female    DOB: 1966/12/16, 50 y.o.   MRN: [id number]  10/29/2016  Follow-up (RIGHT SHOULDER)   HPI This 50 y.o. female presents for two week follow-up and evaluation of R shoulder pain/sprain, R supraclavicular LAD.  Has not undergone CT chest as discussed at last visit.  R shoulder pain has continued to improve since last visit.  Now only hurts with overuse of R shoulder at work.  Intermittent pain; will use medical patch with improvement.  Has been using patch for past 2-3 days.  Pain has been less than last visit.  No longer taking Meloxicam prescribed on 10/03/16.  Posterior neck pain also included with headaches.  Will also have warmth in R posterior neck at times with B shoulder pain; wants to know if related to R shoulder pain.  Onset in October one week before last visit.  Visited daughter who is attending [employer]; sat in chair for three hours; that night, had B shoulder pain and R supraclavicular LAD.  Now all symptoms are better.  Admits to excessive worry.    Interpreter [phone].   Insomnia: excessive worry interferes with sleep.  Does not fall asleep until 2-3am due to feeling hot in body and unable to sleep; will eventually will sleep until morning.  Onset of insomnia for 2-3 days.  No fever; no chills.  Menopausal.  No headache.  No sore throat.  No ear pain.  No cough.  R sided neck pain at times.  R posterior neck pain has improved/resolved.     Review of Systems  Constitutional: Negative for chills, diaphoresis, fatigue and fever.  HENT: Negative for congestion, ear discharge, ear pain, facial swelling, postnasal drip, rhinorrhea, sinus pain, sinus pressure, sneezing, sore throat, trouble swallowing and voice change.   Eyes: Negative for visual disturbance.  Respiratory: Negative for cough and shortness of breath.   Cardiovascular: Negative for chest pain, palpitations and leg swelling.  Gastrointestinal: Negative for  abdominal pain, constipation, diarrhea, nausea and vomiting.  Endocrine: Negative for cold intolerance, heat intolerance, polydipsia, polyphagia and polyuria.  Musculoskeletal: Positive for arthralgias.  Neurological: Negative for dizziness, tremors, seizures, syncope, facial asymmetry, speech difficulty, weakness, light-headedness, numbness and headaches.  Hematological: Positive for adenopathy.  Psychiatric/Behavioral: Positive for sleep disturbance. Negative for dysphoric mood, self-injury and suicidal ideas. The patient is nervous/anxious.     Past Medical History:  Diagnosis Date  . Abdominal bloating   . Cough   . Dry throat   . FUO (fever of unknown origin)   . H/O one miscarriage   . Headache   . Multiple thyroid nodules   . Thyroid cyst    Past Surgical History:  Procedure Laterality Date  . DILATION AND CURETTAGE OF UTERUS    . THYROIDECTOMY N/A 02/02/2016   Procedure: TOTAL THYROIDECTOMY;  Surgeon: Ralene Ok, MD;  Location: [facility];  Service: General;  Laterality: N/A;   Not on File Current Outpatient Prescriptions  Medication Sig Dispense Refill  . glucosamine-chondroitin 500-400 MG tablet Take 1 tablet by mouth 3 (three) times daily.    Marland Kitchen levothyroxine (SYNTHROID, LEVOTHROID) 75 MCG tablet Take 1 tablet (75 mcg total) by mouth daily. 90 tablet 3  . Multiple Vitamin (MULTIVITAMIN WITH MINERALS) TABS tablet Take 2 tablets by mouth daily.     . calcium-vitamin D (OSCAL WITH D) 500-200 MG-UNIT tablet Take 2 tablets by mouth 3 (three) times daily. (Patient not taking: Reported on 10/29/2016) 60 tablet 0  .  Cholecalciferol (VITAMIN D PO) Take 1 tablet by mouth daily.    . meloxicam (MOBIC) 15 MG tablet Take 1 tablet (15 mg total) by mouth daily. 30 tablet 0   No current facility-administered medications for this visit.    Social History   Social History  . Marital status: Married    Spouse name: N/A  . Number of children: N/A  . Years of education: N/A    Occupational History  . Not on file.   Social History Main Topics  . Smoking status: Never Smoker  . Smokeless tobacco: Never Used  . Alcohol use No  . Drug use: No  . Sexual activity: No   Other Topics Concern  . Not on file   Social History Narrative   ** Merged History Encounter **       No family history on file.     Objective:    BP 100/80 (BP Location: Left Arm, Patient Position: Sitting, Cuff Size: Normal)   Pulse 93   Temp 98.4 F (36.9 C) (Oral)   Resp 16   Ht 5' 2.5" (1.588 m)   Wt 113 lb (51.3 kg)   LMP  (LMP Unknown)   SpO2 97%   BMI 20.34 kg/m  Physical Exam  Constitutional: She is oriented to person, place, and time. She appears well-developed and well-nourished. No distress.  HENT:  Head: Normocephalic and atraumatic.  Right Ear: External ear normal.  Left Ear: External ear normal.  Nose: Nose normal.  Mouth/Throat: Oropharynx is clear and moist.  Eyes: Conjunctivae and EOM are normal. Pupils are equal, round, and reactive to light.  Neck: Normal range of motion. Neck supple. Carotid bruit is not present. No thyromegaly present.  Cardiovascular: Normal rate, regular rhythm, normal heart sounds and intact distal pulses.  Exam reveals no gallop and no friction rub.   No murmur heard. Pulmonary/Chest: Effort normal and breath sounds normal. She has no wheezes. She has no rales.  Abdominal: Soft. Bowel sounds are normal. She exhibits no distension and no mass. There is no tenderness. There is no rebound and no guarding.  Lymphadenopathy:       Head (right side): No submental, no submandibular, no tonsillar, no preauricular, no posterior auricular and no occipital adenopathy present.       Head (left side): No submental, no submandibular, no tonsillar, no preauricular, no posterior auricular and no occipital adenopathy present.    She has no cervical adenopathy.    She has no axillary adenopathy.       Right: No inguinal, no supraclavicular and no  epitrochlear adenopathy present.       Left: No inguinal, no supraclavicular and no epitrochlear adenopathy present.  Neurological: She is alert and oriented to person, place, and time. No cranial nerve deficit.  Skin: Skin is warm and dry. No rash noted. She is not diaphoretic. No erythema. No pallor.  Psychiatric: She has a normal mood and affect. Her behavior is normal.   Results for orders placed or performed in visit on 10/17/16  POCT urinalysis dipstick  Result Value Ref Range   Color, UA yellow yellow   Clarity, UA clear clear   Glucose, UA negative negative   Bilirubin, UA negative negative   Ketones, POC UA negative negative   Spec Grav, UA 1.015    Blood, UA moderate (A) negative   pH, UA 7.0    Protein Ur, POC negative negative   Urobilinogen, UA 0.2    Nitrite, UA Negative Negative  Leukocytes, UA Negative Negative  POCT Microscopic Urinalysis ([facility])  Result Value Ref Range   WBC,UR,HPF,POC None None WBC/hpf   RBC,UR,HPF,POC None None RBC/hpf   Bacteria None None, Too numerous to count   Mucus Absent Absent   Epithelial Cells, UR Per Microscopy None None, Too numerous to count cells/hpf       Assessment & Plan:   1. Other sprain of right shoulder joint   2. LAD (lymphadenopathy), supraclavicular   3. Need for influenza vaccination   4. Psychophysiological insomnia    -R supraclavicular LAD resolved; however, recommend proceeding with CT chest to rule out malignancy; coordinated rescheduling CT chest during visit with interpreter. -R shoulder sprain has improved with worsening since last visit; now improved again; continue with home exercise program. If persists, refer to ortho. -s/p flu vaccine. -having stress reaction regarding etiology of symptoms; reassurance provided; again recommended CT chest to rule out other contributing etiology.   Orders Placed This Encounter  Procedures  . Flu Vaccine QUAD 36+ mos IM   No orders of the defined types were placed  in this encounter.   No Follow-up on file.   Macky Galik Elayne Guerin, M.D. [facility] [address] [place], [place]  [address] [phone] phone [phone] fax

## 2016-10-31 ENCOUNTER — Other Ambulatory Visit: Payer: Self-pay

## 2016-10-31 ENCOUNTER — Telehealth: Payer: Self-pay | Admitting: Radiology

## 2016-10-31 NOTE — Telephone Encounter (Signed)
[facility] called to inform us that pt did not show up for CT scan today.

## 2016-11-01 NOTE — Telephone Encounter (Signed)
Call patient or daughter to inform her that she missed her CT chest today.  Can she or a family member reschedule her?

## 2016-11-04 NOTE — Telephone Encounter (Signed)
Attempted to call pt, left VM to call back

## 2016-11-08 NOTE — Telephone Encounter (Signed)
Spoke with husband.  He will discuss with pt and call [facility] and reschedule.

## 2017-01-09 ENCOUNTER — Ambulatory Visit (INDEPENDENT_AMBULATORY_CARE_PROVIDER_SITE_OTHER): Payer: Self-pay | Admitting: Internal Medicine

## 2017-01-09 DIAGNOSIS — Z0289 Encounter for other administrative examinations: Secondary | ICD-10-CM

## 2017-01-28 IMAGING — DX DG CHEST 2V
2 series · 2 of 2 positions shown · non-contrast
Comparison: None.

CLINICAL DATA: Preop thyroidectomy, cough

EXAM:
CHEST  2 VIEW

[chest pa]
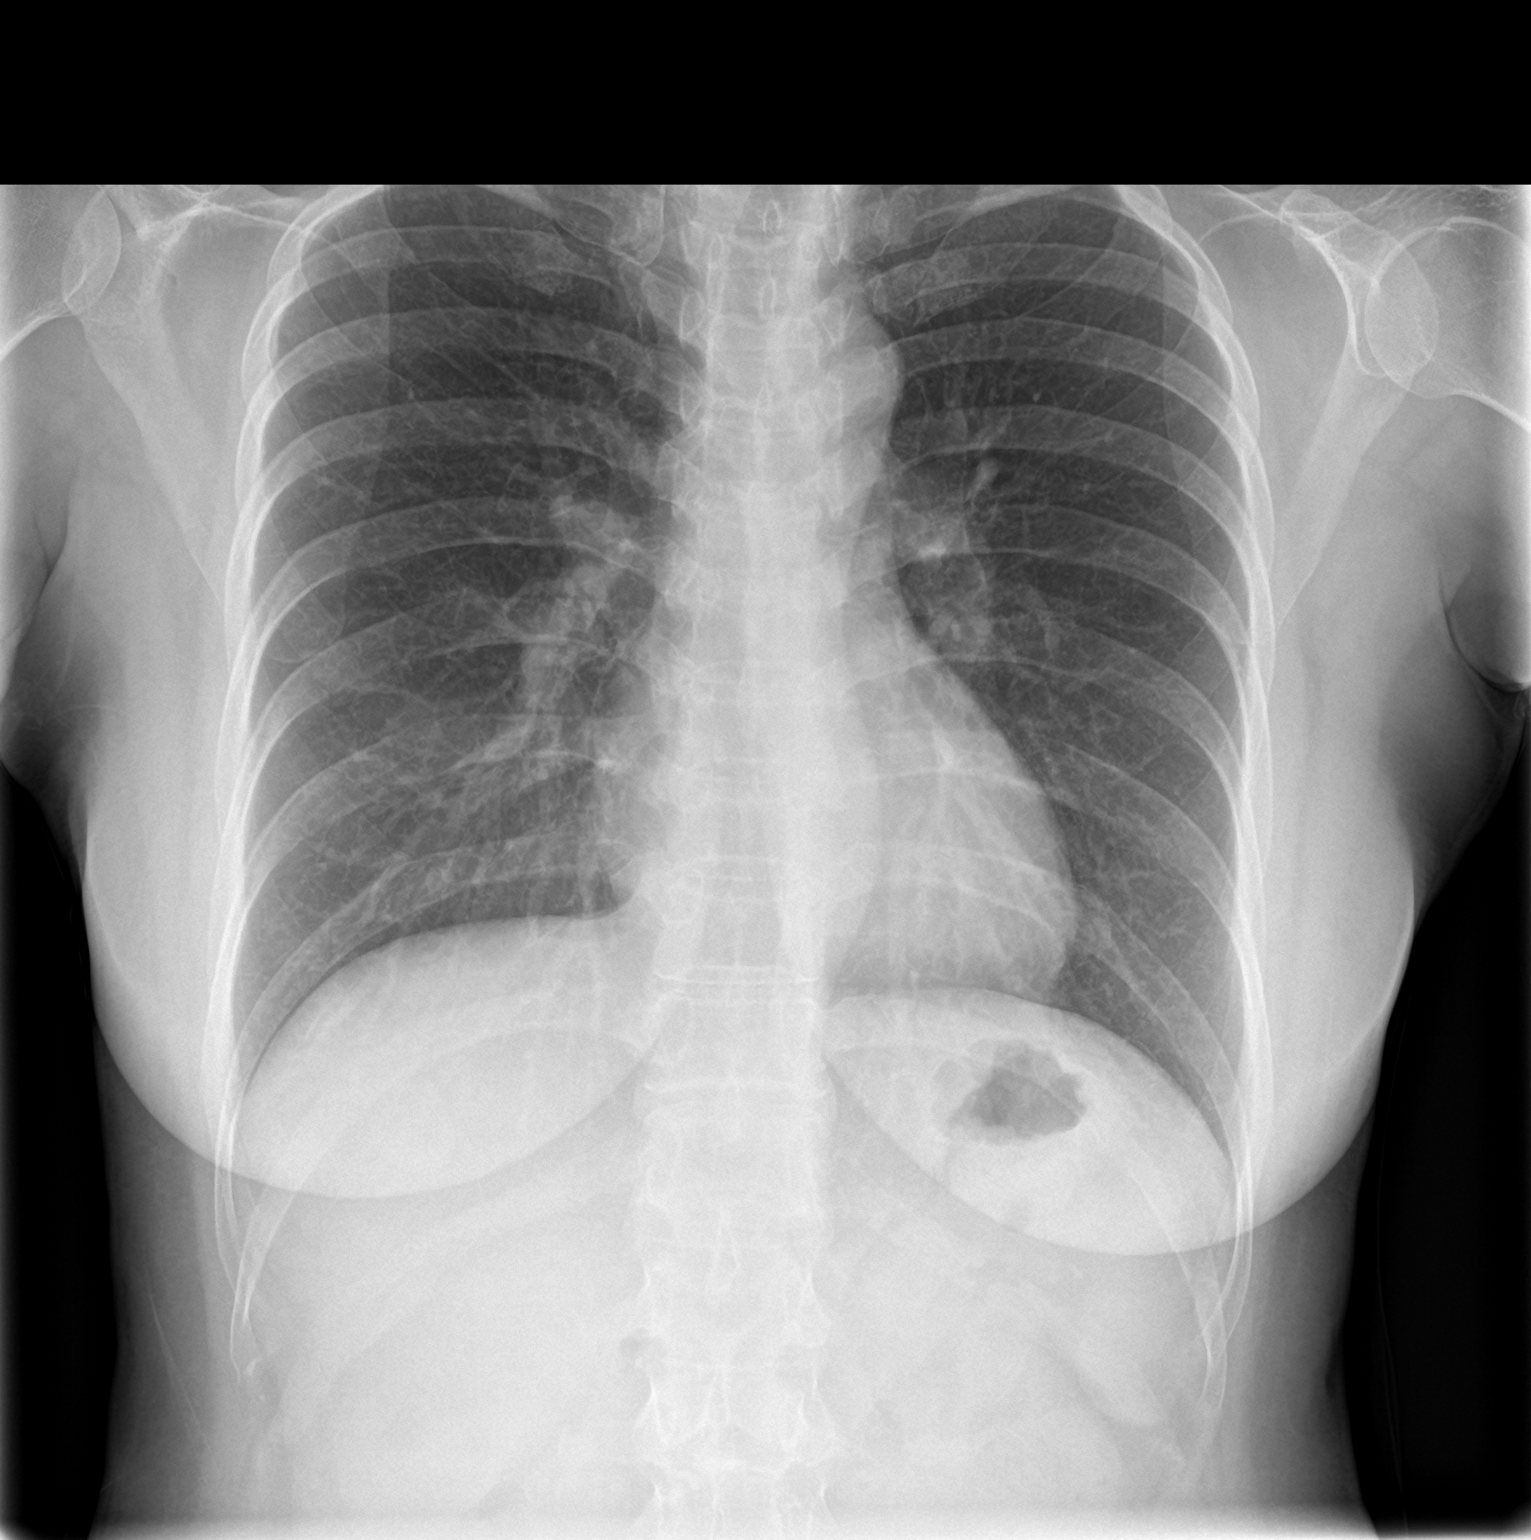

[chest lat]
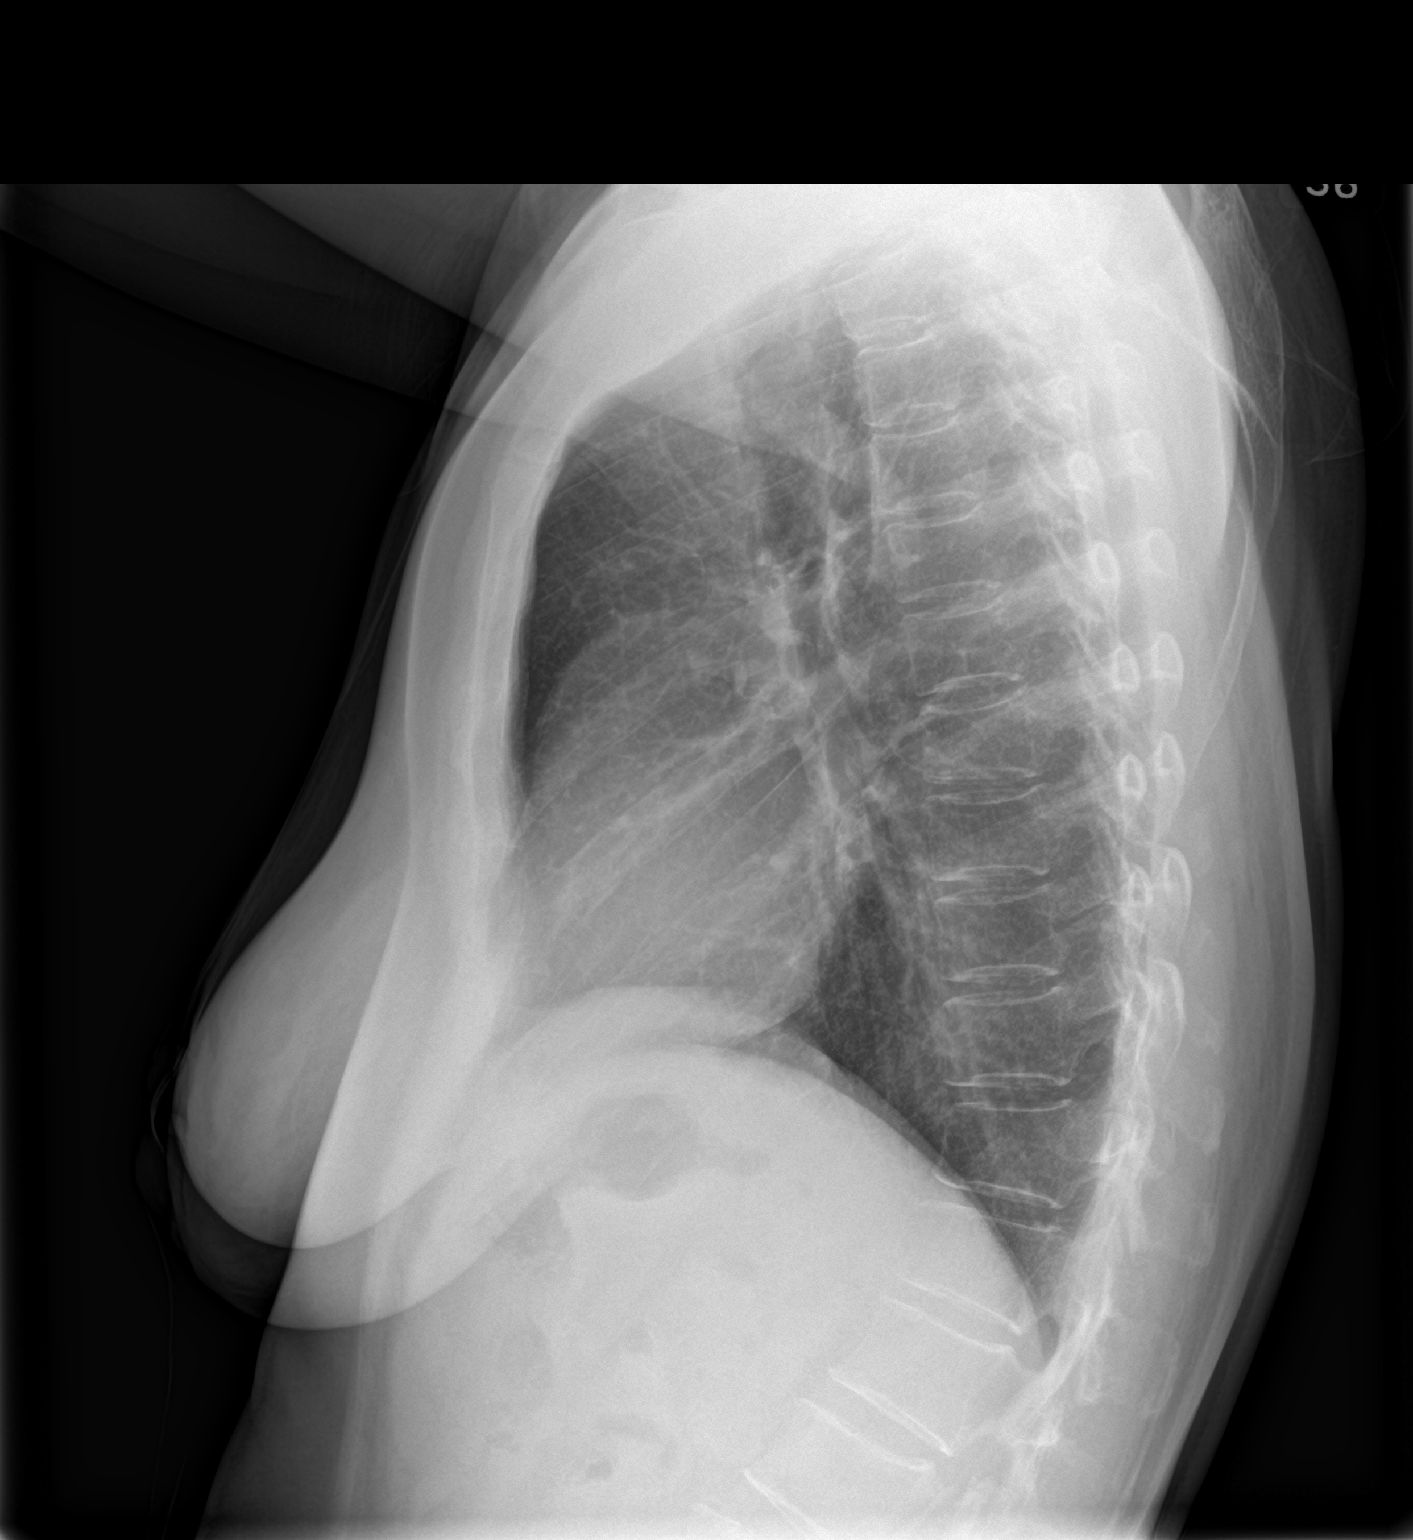

[2 of 2 positions shown; findings below may reference images not displayed]

FINDINGS: No active infiltrate or effusion is seen. Mediastinal and hilar
contours are unremarkable. The heart is within normal limits in
size. No acute bony abnormality is seen. There is deviation of the
superior tracheal air shadow to the left of midline consistent with
enlargement of the right lobe of thyroid.
IMPRESSION: 1. Enlargement of the right lobe of thyroid deviating the superior
tracheal air shadow to the left.
2. No active cardiopulmonary disease.

## 2017-06-18 ENCOUNTER — Other Ambulatory Visit: Payer: Self-pay

## 2017-06-18 MED ORDER — LEVOTHYROXINE SODIUM 75 MCG PO TABS: 75.0000 ug | ORAL_TABLET | Freq: Every day | ORAL | 3 refills | Status: DC

## 2017-06-19 ENCOUNTER — Other Ambulatory Visit: Payer: Self-pay

## 2017-06-19 MED ORDER — LEVOTHYROXINE SODIUM 75 MCG PO TABS: 75.0000 ug | ORAL_TABLET | Freq: Every day | ORAL | 3 refills | Status: DC

## 2017-09-23 DIAGNOSIS — E559 Vitamin D deficiency, unspecified: Secondary | ICD-10-CM | POA: Diagnosis not present

## 2017-09-23 DIAGNOSIS — Z130 Encounter for screening for diseases of the blood and blood-forming organs and certain disorders involving the immune mechanism: Secondary | ICD-10-CM | POA: Diagnosis not present

## 2017-09-23 DIAGNOSIS — Z1231 Encounter for screening mammogram for malignant neoplasm of breast: Secondary | ICD-10-CM | POA: Diagnosis not present

## 2017-09-23 DIAGNOSIS — Z6820 Body mass index (BMI) 20.0-20.9, adult: Secondary | ICD-10-CM | POA: Diagnosis not present

## 2017-09-23 DIAGNOSIS — Z0000 Encounter for general adult medical examination without abnormal findings: Secondary | ICD-10-CM | POA: Diagnosis not present

## 2017-09-23 DIAGNOSIS — Z13220 Encounter for screening for lipoid disorders: Secondary | ICD-10-CM | POA: Diagnosis not present

## 2017-09-23 DIAGNOSIS — Z01419 Encounter for gynecological examination (general) (routine) without abnormal findings: Secondary | ICD-10-CM | POA: Diagnosis not present

## 2017-10-21 ENCOUNTER — Ambulatory Visit (INDEPENDENT_AMBULATORY_CARE_PROVIDER_SITE_OTHER): Payer: BLUE CROSS/BLUE SHIELD

## 2017-10-21 ENCOUNTER — Encounter: Payer: Self-pay | Admitting: Physician Assistant

## 2017-10-21 ENCOUNTER — Ambulatory Visit (INDEPENDENT_AMBULATORY_CARE_PROVIDER_SITE_OTHER): Payer: BLUE CROSS/BLUE SHIELD | Admitting: Physician Assistant

## 2017-10-21 VITALS — BP 110/76 | HR 83 | Temp 98.1°F | Resp 16 | Ht 61.5 in | Wt 111.9 lb

## 2017-10-21 DIAGNOSIS — M25611 Stiffness of right shoulder, not elsewhere classified: Secondary | ICD-10-CM | POA: Diagnosis not present

## 2017-10-21 DIAGNOSIS — M25511 Pain in right shoulder: Secondary | ICD-10-CM

## 2017-10-21 MED ORDER — MELOXICAM 15 MG PO TABS: 15.0000 mg | ORAL_TABLET | Freq: Every day | ORAL | 0 refills | Status: DC

## 2017-10-21 MED ORDER — CYCLOBENZAPRINE HCL 10 MG PO TABS: 10.0000 mg | ORAL_TABLET | Freq: Every day | ORAL | 0 refills | Status: DC

## 2017-10-21 NOTE — Progress Notes (Signed)
Tanya Armstrong  MRN: [id number] DOB: 01-13-1966  PCP: Patient, No Pcp Per  Subjective:  Pt is a 51 year old female who presents to clinic for right shoulder pain x > 1 months. She speaks vietnamese, mobile interpreter [phone]  She was here for this c/c a few times one year ago: + supraclavicular LAD; TB gold, HIV, urinalysis, CBC.  All studies negative other than blood in urine. CXR negative; R shoulder films negative. She was referred for CT chest, however never had one. Her symptoms eventually resolved.  When asked if she has had this problem before she says no. When reminded about her visits in 2017 for R shoulder pain, she says she was here because of the cold weather last year, not because of her shoulder.   Today, pt c/o right shoulder pain x 2 months or so. She "over worked recently" - she works as a Scientist, forensic 3 days/week.  Pain is located on lateral aspect of right shoulder. Pain is present while sleeping at night - hurts worse when laying on right side. Pain is present every day. Endorses decreased ROM.  Denies n/t, muscle weakness, fever, chills, neck pain, swelling, redness, warmth.  She has applied hot oil to the area - helped a little bit.    Review of Systems  Constitutional: Negative for chills, diaphoresis, fatigue and fever.  Respiratory: Negative for cough, chest tightness and shortness of breath.   Cardiovascular: Negative for chest pain and palpitations.  Musculoskeletal: Positive for arthralgias (right shoulder). Negative for back pain, neck pain and neck stiffness.  Neurological: Negative for weakness and numbness.  Psychiatric/Behavioral: Positive for sleep disturbance.    Patient Active Problem List   Diagnosis Date Noted  . 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 S/P total thyroidectomy 02/02/2016  . SIRS (systemic inflammatory response syndrome) ([place]) 01/30/2015  . Transaminitis   . Travel sickness     Current Outpatient Prescriptions on File  Prior to Visit  Medication Sig Dispense Refill  . calcium-vitamin D (OSCAL WITH D) 500-200 MG-UNIT tablet Take 2 tablets by mouth 3 (three) times daily. 60 tablet 0  . Cholecalciferol (VITAMIN D PO) Take 1 tablet by mouth daily.    Marland Kitchen levothyroxine (SYNTHROID, LEVOTHROID) 75 MCG tablet Take 1 tablet (75 mcg total) by mouth daily. 90 tablet 3  . Multiple Vitamin (MULTIVITAMIN WITH MINERALS) TABS tablet Take 2 tablets by mouth daily.     Marland Kitchen glucosamine-chondroitin 500-400 MG tablet Take 1 tablet by mouth 3 (three) times daily.    . meloxicam (MOBIC) 15 MG tablet Take 1 tablet (15 mg total) by mouth daily. (Patient not taking: Reported on 10/21/2017) 30 tablet 0   No current facility-administered medications on file prior to visit.     No Known Allergies   Objective:  BP 110/76   Pulse 83   Temp 98.1 F (36.7 C) (Oral)   Resp 16   Ht 5' 1.5" (1.562 m)   Wt 111 lb 14.2 oz (50.8 kg)   LMP  (LMP Unknown)   SpO2 99%   BMI 20.80 kg/m   Physical Exam  Musculoskeletal:       Right shoulder: She exhibits decreased range of motion (with anterior and lateral abduction) and tenderness. She exhibits no bony tenderness, no swelling, no effusion, no crepitus, no deformity, no spasm and normal strength.       Left shoulder: She exhibits normal range of motion, no tenderness, no bony tenderness, no swelling, no deformity, no pain and normal  strength.       Cervical back: She exhibits tenderness (mild posterolateral deltoid). She exhibits normal range of motion, no bony tenderness, no swelling, no edema, no pain and no spasm.  Positive Neer and Hawkins-Kennedy maneuvers.  Unable to abduct arm actively or passively above 90 degrees.    Lymphadenopathy:       Right: No supraclavicular adenopathy present.       Left: No supraclavicular adenopathy present.    Dg Shoulder Right  Result Date: 10/21/2017 CLINICAL DATA:  RIGHT shoulder pain for 2 months, no known injury, reduced range of motion EXAM:  RIGHT SHOULDER - 2+ VIEW COMPARISON:  10/03/2016 FINDINGS: Osseous demineralization. AC joint alignment normal. No acute fracture, dislocation, or bone destruction. Visualized RIGHT ribs intact. IMPRESSION: No acute abnormalities. Electronically Signed   By: Lavonia Dana M.D.   On: 10/21/2017 17:01    Assessment and Plan :  1. Right shoulder pain, unspecified chronicity 2. Decreased range of motion of right shoulder - meloxicam (MOBIC) 15 MG tablet; Take 1 tablet (15 mg total) by mouth daily.  Dispense: 30 tablet; Refill: 0 - cyclobenzaprine (FLEXERIL) 10 MG tablet; Take 1 tablet (10 mg total) by mouth at bedtime.  Dispense: 30 tablet; Refill: 0 - DG Shoulder Right; Future - Ambulatory referral to Orthopedic Surgery - Pt c/o 2 months R shoulder pain. She had extensive work-up last year with Dr. Tamala Julian for same c/c: negative TB, HIV, CBC wnl. At that time she had normal ROM. Pt never got CT chest to r/o malignancy. Today there is no LAD and she exhibits reduced ROM.  Suspect possible shoulder impingement. X-ray is negative. Encouraged rest, ice and NSAIDs. Plan to refer to ortho for evaluation.   Mercer Pod, PA-C  [facility] 10/21/2017 4:28 PM

## 2017-10-21 NOTE — Patient Instructions (Addendum)
-  You will receive a phone call to schedule an appointment with orthopedics.  - Do not sleep on your right side. - Put ice in a plastic bag. Place a towel between your skin and the bag or between your plaster splint and the bag. Leave the ice on for 20 minutes, 2-3 times a day. Keep the area elevated above the level of your heart. Do this 2-3 times a day until swelling improves.  - Avoid activities that aggravate symptoms, including all overhead activities. - Take Meloxicam. Do not use with any other otc pain medication other than tylenol/acetaminophen - so no aleve, ibuprofen, motrin, advil, etc.  - Flexeril is a muscle relaxer - take this at bedtime.   Thank you for coming in today. I hope you feel we met your needs.  Feel free to call PCP if you have any questions or further requests.  Please consider signing up for MyChart if you do not already have it, as this is a great way to communicate with me.  Best,  Whitney McVey, PA-C   IF you received an x-ray today, you will receive an invoice from [facility]. Please contact [facility] at [phone] with questions or concerns regarding your invoice.   IF you received labwork today, you will receive an invoice from [facility]. Please contact [facility] at [phone] with questions or concerns regarding your invoice.   Our billing staff will not be able to assist you with questions regarding bills from these companies.  You will be contacted with the lab results as soon as they are available. The fastest way to get your results is to activate your My Chart account. Instructions are located on the last page of this paperwork. If you have not heard from [place] regarding the results in 2 weeks, please contact this office.

## 2017-11-17 ENCOUNTER — Other Ambulatory Visit: Payer: Self-pay | Admitting: Physician Assistant

## 2017-11-17 DIAGNOSIS — M25511 Pain in right shoulder: Secondary | ICD-10-CM

## 2018-04-14 DIAGNOSIS — D259 Leiomyoma of uterus, unspecified: Secondary | ICD-10-CM | POA: Diagnosis not present

## 2018-04-14 DIAGNOSIS — N760 Acute vaginitis: Secondary | ICD-10-CM | POA: Diagnosis not present

## 2018-04-28 DIAGNOSIS — D259 Leiomyoma of uterus, unspecified: Secondary | ICD-10-CM | POA: Diagnosis not present

## 2018-04-28 DIAGNOSIS — N950 Postmenopausal bleeding: Secondary | ICD-10-CM | POA: Diagnosis not present

## 2018-05-25 ENCOUNTER — Encounter: Payer: Self-pay | Admitting: Family Medicine

## 2018-05-27 ENCOUNTER — Other Ambulatory Visit: Payer: Self-pay | Admitting: Internal Medicine

## 2018-09-29 DIAGNOSIS — Z6820 Body mass index (BMI) 20.0-20.9, adult: Secondary | ICD-10-CM | POA: Diagnosis not present

## 2018-09-29 DIAGNOSIS — Z1231 Encounter for screening mammogram for malignant neoplasm of breast: Secondary | ICD-10-CM | POA: Diagnosis not present

## 2018-09-29 DIAGNOSIS — E559 Vitamin D deficiency, unspecified: Secondary | ICD-10-CM | POA: Diagnosis not present

## 2018-09-29 DIAGNOSIS — Z01419 Encounter for gynecological examination (general) (routine) without abnormal findings: Secondary | ICD-10-CM | POA: Diagnosis not present

## 2019-01-20 ENCOUNTER — Other Ambulatory Visit: Payer: Self-pay | Admitting: Internal Medicine

## 2019-01-20 NOTE — Telephone Encounter (Signed)
She was lost for f/u  - refills per PCP

## 2019-01-20 NOTE — Telephone Encounter (Signed)
Please advise 

## 2019-01-20 NOTE — Telephone Encounter (Signed)
Patient has not been seen since 09/05/16 refill or refuse please advise

## 2019-01-26 DIAGNOSIS — Z1211 Encounter for screening for malignant neoplasm of colon: Secondary | ICD-10-CM | POA: Diagnosis not present

## 2019-01-26 DIAGNOSIS — E782 Mixed hyperlipidemia: Secondary | ICD-10-CM | POA: Diagnosis not present

## 2019-02-27 ENCOUNTER — Other Ambulatory Visit: Payer: Self-pay | Admitting: Internal Medicine

## 2019-03-01 ENCOUNTER — Telehealth: Payer: Self-pay | Admitting: Internal Medicine

## 2019-03-02 ENCOUNTER — Other Ambulatory Visit: Payer: Self-pay | Admitting: Internal Medicine

## 2019-03-02 NOTE — Telephone Encounter (Signed)
Pharmacy has sent an RX for Levothyroxine (see attached).  Please call in ASAP as patient has come into office requesting refill.  Patient has 3 pills left

## 2019-03-02 NOTE — Telephone Encounter (Signed)
Please refer to previous chart refill requests, this patient is no longer under Dr. Arman Filter care:     01/20/19 9:54 AM  Note    She was lost for f/u  - refills per PCP

## 2019-03-04 DIAGNOSIS — E782 Mixed hyperlipidemia: Secondary | ICD-10-CM | POA: Diagnosis not present

## 2019-03-04 DIAGNOSIS — E890 Postprocedural hypothyroidism: Secondary | ICD-10-CM | POA: Diagnosis not present

## 2019-12-16 DIAGNOSIS — Z20828 Contact with and (suspected) exposure to other viral communicable diseases: Secondary | ICD-10-CM | POA: Diagnosis not present

## 2019-12-28 DIAGNOSIS — E890 Postprocedural hypothyroidism: Secondary | ICD-10-CM | POA: Diagnosis not present

## 2019-12-28 DIAGNOSIS — E782 Mixed hyperlipidemia: Secondary | ICD-10-CM | POA: Diagnosis not present

## 2020-06-27 DIAGNOSIS — E782 Mixed hyperlipidemia: Secondary | ICD-10-CM | POA: Diagnosis not present

## 2020-06-27 DIAGNOSIS — E890 Postprocedural hypothyroidism: Secondary | ICD-10-CM | POA: Diagnosis not present

## 2020-06-27 DIAGNOSIS — Z7189 Other specified counseling: Secondary | ICD-10-CM | POA: Diagnosis not present

## 2020-06-27 DIAGNOSIS — Z1211 Encounter for screening for malignant neoplasm of colon: Secondary | ICD-10-CM | POA: Diagnosis not present

## 2020-07-03 DIAGNOSIS — Z1211 Encounter for screening for malignant neoplasm of colon: Secondary | ICD-10-CM | POA: Diagnosis not present

## 2020-07-18 DIAGNOSIS — Z6820 Body mass index (BMI) 20.0-20.9, adult: Secondary | ICD-10-CM | POA: Diagnosis not present

## 2020-07-18 DIAGNOSIS — Z01419 Encounter for gynecological examination (general) (routine) without abnormal findings: Secondary | ICD-10-CM | POA: Diagnosis not present

## 2020-07-18 DIAGNOSIS — Z1231 Encounter for screening mammogram for malignant neoplasm of breast: Secondary | ICD-10-CM | POA: Diagnosis not present

## 2020-07-18 DIAGNOSIS — Z1151 Encounter for screening for human papillomavirus (HPV): Secondary | ICD-10-CM | POA: Diagnosis not present

## 2021-01-02 DIAGNOSIS — E890 Postprocedural hypothyroidism: Secondary | ICD-10-CM | POA: Diagnosis not present

## 2021-01-02 DIAGNOSIS — Z0000 Encounter for general adult medical examination without abnormal findings: Secondary | ICD-10-CM | POA: Diagnosis not present

## 2021-01-02 DIAGNOSIS — Z23 Encounter for immunization: Secondary | ICD-10-CM | POA: Diagnosis not present

## 2021-01-02 DIAGNOSIS — E782 Mixed hyperlipidemia: Secondary | ICD-10-CM | POA: Diagnosis not present

## 2021-06-05 DIAGNOSIS — Z1211 Encounter for screening for malignant neoplasm of colon: Secondary | ICD-10-CM | POA: Diagnosis not present

## 2021-06-05 DIAGNOSIS — E782 Mixed hyperlipidemia: Secondary | ICD-10-CM | POA: Diagnosis not present

## 2021-06-05 DIAGNOSIS — E890 Postprocedural hypothyroidism: Secondary | ICD-10-CM | POA: Diagnosis not present

## 2021-06-19 DIAGNOSIS — Z1211 Encounter for screening for malignant neoplasm of colon: Secondary | ICD-10-CM | POA: Diagnosis not present

## 2021-07-24 DIAGNOSIS — Z1231 Encounter for screening mammogram for malignant neoplasm of breast: Secondary | ICD-10-CM | POA: Diagnosis not present

## 2021-07-24 DIAGNOSIS — Z681 Body mass index (BMI) 19 or less, adult: Secondary | ICD-10-CM | POA: Diagnosis not present

## 2021-07-24 DIAGNOSIS — Z01419 Encounter for gynecological examination (general) (routine) without abnormal findings: Secondary | ICD-10-CM | POA: Diagnosis not present

## 2021-12-30 ENCOUNTER — Emergency Department (HOSPITAL_COMMUNITY)
Admission: EM | Admit: 2021-12-30 | Discharge: 2021-12-30 | Disposition: A | Discharge status: Home / Self Care | Payer: BC Managed Care – PPO | Attending: Emergency Medicine | Admitting: Emergency Medicine

## 2021-12-30 ENCOUNTER — Other Ambulatory Visit: Payer: Self-pay

## 2021-12-30 ENCOUNTER — Encounter (HOSPITAL_COMMUNITY): Payer: Self-pay | Admitting: Emergency Medicine

## 2021-12-30 DIAGNOSIS — J069 Acute upper respiratory infection, unspecified: Secondary | ICD-10-CM | POA: Insufficient documentation

## 2021-12-30 DIAGNOSIS — U071 COVID-19: Secondary | ICD-10-CM | POA: Insufficient documentation

## 2021-12-30 DIAGNOSIS — B9789 Other viral agents as the cause of diseases classified elsewhere: Secondary | ICD-10-CM | POA: Diagnosis not present

## 2021-12-30 DIAGNOSIS — R059 Cough, unspecified: Secondary | ICD-10-CM | POA: Diagnosis not present

## 2021-12-30 LAB — RESP PANEL BY RT-PCR (FLU A&B, COVID) ARPGX2
Influenza A by PCR: NEGATIVE
Influenza B by PCR: NEGATIVE
SARS Coronavirus 2 by RT PCR: POSITIVE — AB

## 2021-12-30 NOTE — Discharge Instructions (Addendum)
Follow-up in your MyChart account later tonight for your test results. OTC cold medication such as Mucinex or Delsym as needed as directed.

## 2021-12-30 NOTE — ED Triage Notes (Signed)
C/o cough for a few days. NAD.  COVID swab and discharged by PA at triage.

## 2021-12-30 NOTE — ED Provider Notes (Signed)
[facility] EMERGENCY DEPARTMENT Provider Note   CSN: [id number] Arrival date & time: 12/30/21  1757     History  Chief Complaint  Patient presents with   Cough    Tanya Armstrong is a 56 y.o. female.  56 year old female with complaint of cough since yesterday.  Denies fevers, body aches.  Is here with family member with similar symptoms.  Patient states a friend did a COVID test on her which was positive, unsure if this was accurate.  No other complaints or concerns.  Denies history of asthma, chronic lung disease, is a non-smoker.      Home Medications Prior to Admission medications   Medication Sig Start Date End Date Taking? Authorizing Provider  calcium-vitamin D (OSCAL WITH D) 500-200 MG-UNIT tablet Take 2 tablets by mouth 3 (three) times daily. 02/02/16   Ralene Ok, MD  Cholecalciferol (VITAMIN D PO) Take 1 tablet by mouth daily.    [provider]  cyclobenzaprine (FLEXERIL) 10 MG tablet Take 1 tablet (10 mg total) by mouth at bedtime. 10/21/17   Armstrong, Gelene Mink, PA-C  glucosamine-chondroitin 500-400 MG tablet Take 1 tablet by mouth 3 (three) times daily.    [provider]  levothyroxine (SYNTHROID, LEVOTHROID) 75 MCG tablet Take 1 tablet (75 mcg total) by mouth daily. 06/19/17   Philemon Kingdom, MD  levothyroxine (SYNTHROID, LEVOTHROID) 75 MCG tablet TAKE 1 TABLET BY MOUTH EVERY DAY 05/27/18   Philemon Kingdom, MD  meloxicam (MOBIC) 15 MG tablet TAKE 1 TABLET BY MOUTH EVERY DAY 11/17/17   Armstrong, Gelene Mink, PA-C  Multiple Vitamin (MULTIVITAMIN WITH MINERALS) TABS tablet Take 2 tablets by mouth daily.     [provider]      Allergies    Patient has no known allergies.    Review of Systems   Review of Systems  Constitutional:  Negative for chills and fever.  HENT:  Negative for congestion.   Respiratory:  Positive for cough.   Gastrointestinal:  Negative for diarrhea and vomiting.  Musculoskeletal:   Negative for arthralgias and myalgias.  Skin:  Negative for rash and wound.  Allergic/Immunologic: Negative for immunocompromised state.  Neurological:  Negative for weakness.  Hematological:  Negative for adenopathy.  Psychiatric/Behavioral:  Negative for confusion.   All other systems reviewed and are negative.  Physical Exam Updated Vital Signs BP 122/83 (BP Location: Left Arm)    Pulse 96    Temp 99.2 F (37.3 C) (Oral)    Resp 14    LMP  (LMP Unknown)    SpO2 100%  Physical Exam Vitals and nursing note reviewed.  Constitutional:      General: She is not in acute distress.    Appearance: She is well-developed. She is not diaphoretic.  HENT:     Head: Normocephalic and atraumatic.     Right Ear: Tympanic membrane and ear canal normal.     Left Ear: Tympanic membrane and ear canal normal.     Nose: Nose normal. No congestion.     Mouth/Throat:     Mouth: Mucous membranes are moist.     Pharynx: No oropharyngeal exudate or posterior oropharyngeal erythema.  Eyes:     Conjunctiva/sclera: Conjunctivae normal.  Cardiovascular:     Rate and Rhythm: Normal rate and regular rhythm.     Heart sounds: Normal heart sounds.  Pulmonary:     Effort: Pulmonary effort is normal.     Breath sounds: Normal breath sounds.  Musculoskeletal:  Cervical back: Neck supple.  Lymphadenopathy:     Cervical: No cervical adenopathy.  Skin:    General: Skin is warm and dry.     Findings: No erythema or rash.  Neurological:     Mental Status: She is alert and oriented to person, place, and time.  Psychiatric:        Behavior: Behavior normal.    ED Results / Procedures / Treatments   Labs (all labs ordered are listed, but only abnormal results are displayed) Labs Reviewed - No data to display  EKG None  Radiology No results found.  Procedures Procedures    Medications Ordered in ED Medications - No data to display  ED Course/ Medical Decision Making/ A&P                            Medical Decision Making         Final Clinical Impression(s) / ED Diagnoses Final diagnoses:  Viral URI with cough    Rx / DC Orders ED Discharge Orders     None         Roque Lias 12/30/21 1833    Dorie Rank, MD 12/30/21 2113

## 2022-01-04 DIAGNOSIS — R059 Cough, unspecified: Secondary | ICD-10-CM | POA: Diagnosis not present

## 2022-01-29 DIAGNOSIS — E782 Mixed hyperlipidemia: Secondary | ICD-10-CM | POA: Diagnosis not present

## 2022-01-29 DIAGNOSIS — Z0000 Encounter for general adult medical examination without abnormal findings: Secondary | ICD-10-CM | POA: Diagnosis not present

## 2022-01-29 DIAGNOSIS — E890 Postprocedural hypothyroidism: Secondary | ICD-10-CM | POA: Diagnosis not present

## 2022-07-30 DIAGNOSIS — Z01419 Encounter for gynecological examination (general) (routine) without abnormal findings: Secondary | ICD-10-CM | POA: Diagnosis not present

## 2022-07-30 DIAGNOSIS — Z1231 Encounter for screening mammogram for malignant neoplasm of breast: Secondary | ICD-10-CM | POA: Diagnosis not present

## 2022-07-30 DIAGNOSIS — Z681 Body mass index (BMI) 19 or less, adult: Secondary | ICD-10-CM | POA: Diagnosis not present

## 2022-10-22 DIAGNOSIS — D128 Benign neoplasm of rectum: Secondary | ICD-10-CM | POA: Diagnosis not present

## 2022-10-22 DIAGNOSIS — Z1211 Encounter for screening for malignant neoplasm of colon: Secondary | ICD-10-CM | POA: Diagnosis not present

## 2022-10-22 DIAGNOSIS — K621 Rectal polyp: Secondary | ICD-10-CM | POA: Diagnosis not present

## 2022-10-22 DIAGNOSIS — K644 Residual hemorrhoidal skin tags: Secondary | ICD-10-CM | POA: Diagnosis not present

## 2022-10-22 DIAGNOSIS — K648 Other hemorrhoids: Secondary | ICD-10-CM | POA: Diagnosis not present

## 2022-10-22 DIAGNOSIS — K5730 Diverticulosis of large intestine without perforation or abscess without bleeding: Secondary | ICD-10-CM | POA: Diagnosis not present

## 2023-02-25 DIAGNOSIS — E782 Mixed hyperlipidemia: Secondary | ICD-10-CM | POA: Diagnosis not present

## 2023-02-25 DIAGNOSIS — Z13220 Encounter for screening for lipoid disorders: Secondary | ICD-10-CM | POA: Diagnosis not present

## 2023-02-25 DIAGNOSIS — E890 Postprocedural hypothyroidism: Secondary | ICD-10-CM | POA: Diagnosis not present

## 2023-02-25 DIAGNOSIS — Z0000 Encounter for general adult medical examination without abnormal findings: Secondary | ICD-10-CM | POA: Diagnosis not present

## 2023-02-25 DIAGNOSIS — Z1159 Encounter for screening for other viral diseases: Secondary | ICD-10-CM | POA: Diagnosis not present

## 2023-09-16 DIAGNOSIS — Z1331 Encounter for screening for depression: Secondary | ICD-10-CM | POA: Diagnosis not present

## 2023-09-16 DIAGNOSIS — Z124 Encounter for screening for malignant neoplasm of cervix: Secondary | ICD-10-CM | POA: Diagnosis not present

## 2023-09-16 DIAGNOSIS — Z1231 Encounter for screening mammogram for malignant neoplasm of breast: Secondary | ICD-10-CM | POA: Diagnosis not present

## 2023-09-16 DIAGNOSIS — Z01419 Encounter for gynecological examination (general) (routine) without abnormal findings: Secondary | ICD-10-CM | POA: Diagnosis not present

## 2023-12-10 ENCOUNTER — Emergency Department (HOSPITAL_BASED_OUTPATIENT_CLINIC_OR_DEPARTMENT_OTHER): Payer: BC Managed Care – PPO

## 2023-12-10 ENCOUNTER — Encounter (HOSPITAL_BASED_OUTPATIENT_CLINIC_OR_DEPARTMENT_OTHER): Payer: Self-pay

## 2023-12-10 ENCOUNTER — Other Ambulatory Visit: Payer: Self-pay

## 2023-12-10 ENCOUNTER — Inpatient Hospital Stay (HOSPITAL_BASED_OUTPATIENT_CLINIC_OR_DEPARTMENT_OTHER)
Admission: EM | Admit: 2023-12-10 | Discharge: 2023-12-12 | DRG: 143 | Disposition: A | Discharge status: Home / Self Care | Payer: BC Managed Care – PPO | Attending: Pulmonary Disease | Admitting: Pulmonary Disease

## 2023-12-10 DIAGNOSIS — J36 Peritonsillar abscess: Secondary | ICD-10-CM | POA: Diagnosis not present

## 2023-12-10 DIAGNOSIS — J988 Other specified respiratory disorders: Secondary | ICD-10-CM | POA: Insufficient documentation

## 2023-12-10 DIAGNOSIS — R000 Tachycardia, unspecified: Secondary | ICD-10-CM | POA: Diagnosis not present

## 2023-12-10 DIAGNOSIS — E039 Hypothyroidism, unspecified: Secondary | ICD-10-CM | POA: Insufficient documentation

## 2023-12-10 DIAGNOSIS — K0510 Chronic gingivitis, plaque induced: Secondary | ICD-10-CM | POA: Diagnosis not present

## 2023-12-10 DIAGNOSIS — J351 Hypertrophy of tonsils: Secondary | ICD-10-CM | POA: Diagnosis not present

## 2023-12-10 DIAGNOSIS — Z4682 Encounter for fitting and adjustment of non-vascular catheter: Secondary | ICD-10-CM | POA: Diagnosis not present

## 2023-12-10 DIAGNOSIS — E890 Postprocedural hypothyroidism: Secondary | ICD-10-CM | POA: Diagnosis not present

## 2023-12-10 DIAGNOSIS — J9600 Acute respiratory failure, unspecified whether with hypoxia or hypercapnia: Secondary | ICD-10-CM | POA: Diagnosis present

## 2023-12-10 DIAGNOSIS — R9431 Abnormal electrocardiogram [ECG] [EKG]: Secondary | ICD-10-CM | POA: Diagnosis not present

## 2023-12-10 DIAGNOSIS — B954 Other streptococcus as the cause of diseases classified elsewhere: Secondary | ICD-10-CM | POA: Diagnosis present

## 2023-12-10 DIAGNOSIS — R590 Localized enlarged lymph nodes: Secondary | ICD-10-CM | POA: Diagnosis not present

## 2023-12-10 DIAGNOSIS — Z79890 Hormone replacement therapy (postmenopausal): Secondary | ICD-10-CM | POA: Diagnosis not present

## 2023-12-10 DIAGNOSIS — Z79899 Other long term (current) drug therapy: Secondary | ICD-10-CM

## 2023-12-10 DIAGNOSIS — E041 Nontoxic single thyroid nodule: Secondary | ICD-10-CM | POA: Diagnosis not present

## 2023-12-10 DIAGNOSIS — R509 Fever, unspecified: Secondary | ICD-10-CM | POA: Diagnosis not present

## 2023-12-10 DIAGNOSIS — R7981 Abnormal blood-gas level: Secondary | ICD-10-CM | POA: Diagnosis not present

## 2023-12-10 LAB — CBC WITH DIFFERENTIAL/PLATELET
Abs Immature Granulocytes: 0.1 10*3/uL — ABNORMAL HIGH (ref 0.00–0.07)
Basophils Absolute: 0 10*3/uL (ref 0.0–0.1)
Basophils Relative: 0 %
Eosinophils Absolute: 0 10*3/uL (ref 0.0–0.5)
Eosinophils Relative: 0 %
HCT: 41 % (ref 36.0–46.0)
Hemoglobin: 13.4 g/dL (ref 12.0–15.0)
Immature Granulocytes: 1 %
Lymphocytes Relative: 8 %
Lymphs Abs: 1.5 10*3/uL (ref 0.7–4.0)
MCH: 30 pg (ref 26.0–34.0)
MCHC: 32.7 g/dL (ref 30.0–36.0)
MCV: 91.7 fL (ref 80.0–100.0)
Monocytes Absolute: 1.3 10*3/uL — ABNORMAL HIGH (ref 0.1–1.0)
Monocytes Relative: 7 %
Neutro Abs: 16.4 10*3/uL — ABNORMAL HIGH (ref 1.7–7.7)
Neutrophils Relative %: 84 %
Platelets: 254 10*3/uL (ref 150–400)
RBC: 4.47 MIL/uL (ref 3.87–5.11)
RDW: 12.2 % (ref 11.5–15.5)
WBC: 19.3 10*3/uL — ABNORMAL HIGH (ref 4.0–10.5)
nRBC: 0 % (ref 0.0–0.2)

## 2023-12-10 LAB — I-STAT ARTERIAL BLOOD GAS, ED
Acid-base deficit: 1 mmol/L (ref 0.0–2.0)
Bicarbonate: 23.2 mmol/L (ref 20.0–28.0)
Calcium, Ion: 1.16 mmol/L (ref 1.15–1.40)
HCT: 36 % (ref 36.0–46.0)
Hemoglobin: 12.2 g/dL (ref 12.0–15.0)
O2 Saturation: 100 %
Potassium: 3.7 mmol/L (ref 3.5–5.1)
Sodium: 138 mmol/L (ref 135–145)
TCO2: 24 mmol/L (ref 22–32)
pCO2 arterial: 34.9 mm[Hg] (ref 32–48)
pH, Arterial: 7.43 (ref 7.35–7.45)
pO2, Arterial: 434 mm[Hg] — ABNORMAL HIGH (ref 83–108)

## 2023-12-10 LAB — BASIC METABOLIC PANEL
Anion gap: 14 (ref 5–15)
BUN: 23 mg/dL — ABNORMAL HIGH (ref 6–20)
CO2: 26 mmol/L (ref 22–32)
Calcium: 10 mg/dL (ref 8.9–10.3)
Chloride: 101 mmol/L (ref 98–111)
Creatinine, Ser: 0.59 mg/dL (ref 0.44–1.00)
GFR, Estimated: >60 mL/min (ref >60)
Glucose, Bld: 129 mg/dL — ABNORMAL HIGH (ref 70–99)
Potassium: 4.1 mmol/L (ref 3.5–5.1)
Sodium: 141 mmol/L (ref 135–145)

## 2023-12-10 MED ORDER — ETOMIDATE 2 MG/ML IV SOLN
INTRAVENOUS | Status: AC
  Administered 2023-12-10: 25 mg
  Filled 2023-12-10: qty 20

## 2023-12-10 MED ORDER — HEPARIN SODIUM (PORCINE) 5000 UNIT/ML IJ SOLN: 5000.0000 [IU] | Freq: Three times a day (TID) | INTRAMUSCULAR | Status: DC

## 2023-12-10 MED ORDER — FENTANYL CITRATE PF 50 MCG/ML IJ SOSY: 50.0000 ug | PREFILLED_SYRINGE | Freq: Once | INTRAMUSCULAR | Status: DC

## 2023-12-10 MED ORDER — PANTOPRAZOLE SODIUM 40 MG IV SOLR
40.0000 mg | Freq: Every day | INTRAVENOUS | Status: DC
  Administered 2023-12-10 – 2023-12-11 (×2): 40 mg via INTRAVENOUS
  Filled 2023-12-10 (×2): qty 10

## 2023-12-10 MED ORDER — HEPARIN SODIUM (PORCINE) 5000 UNIT/ML IJ SOLN
5000.0000 [IU] | Freq: Three times a day (TID) | INTRAMUSCULAR | Status: DC
  Administered 2023-12-11 – 2023-12-12 (×4): 5000 [IU] via SUBCUTANEOUS
  Filled 2023-12-10 (×4): qty 1

## 2023-12-10 MED ORDER — FENTANYL 2500MCG IN NS 250ML (10MCG/ML) PREMIX INFUSION
0.0000 ug/h | INTRAVENOUS | Status: DC
  Administered 2023-12-11: 200 ug/h via INTRAVENOUS
  Administered 2023-12-11: 50 ug/h via INTRAVENOUS
  Filled 2023-12-10: qty 250

## 2023-12-10 MED ORDER — SUCCINYLCHOLINE CHLORIDE 200 MG/10ML IV SOSY
PREFILLED_SYRINGE | INTRAVENOUS | Status: AC
  Filled 2023-12-10: qty 10

## 2023-12-10 MED ORDER — LEVOTHYROXINE SODIUM 25 MCG PO TABS
75.0000 ug | ORAL_TABLET | Freq: Every day | ORAL | Status: DC
  Administered 2023-12-11 – 2023-12-12 (×2): 75 ug
  Filled 2023-12-10: qty 3

## 2023-12-10 MED ORDER — FENTANYL BOLUS VIA INFUSION
50.0000 ug | INTRAVENOUS | Status: DC | PRN
  Administered 2023-12-11: 50 ug via INTRAVENOUS
  Filled 2023-12-10: qty 100

## 2023-12-10 MED ORDER — FENTANYL CITRATE PF 50 MCG/ML IJ SOSY: 50.0000 ug | PREFILLED_SYRINGE | INTRAMUSCULAR | Status: DC | PRN

## 2023-12-10 MED ORDER — DOCUSATE SODIUM 50 MG/5ML PO LIQD
100.0000 mg | Freq: Two times a day (BID) | ORAL | Status: DC
  Administered 2023-12-10 – 2023-12-11 (×2): 100 mg
  Filled 2023-12-10 (×2): qty 10

## 2023-12-10 MED ORDER — INSULIN ASPART 100 UNIT/ML IJ SOLN
0.0000 [IU] | INTRAMUSCULAR | Status: DC
  Administered 2023-12-11 (×3): 2 [IU] via SUBCUTANEOUS
  Administered 2023-12-11 (×2): 1 [IU] via SUBCUTANEOUS
  Administered 2023-12-11 – 2023-12-12 (×5): 2 [IU] via SUBCUTANEOUS

## 2023-12-10 MED ORDER — PIPERACILLIN-TAZOBACTAM 3.375 G IVPB 30 MIN
3.3750 g | Freq: Once | INTRAVENOUS | Status: AC
  Administered 2023-12-10: 3.375 g via INTRAVENOUS
  Filled 2023-12-10: qty 50

## 2023-12-10 MED ORDER — POLYETHYLENE GLYCOL 3350 17 G PO PACK
17.0000 g | PACK | Freq: Every day | ORAL | Status: DC
  Administered 2023-12-11: 17 g
  Filled 2023-12-10: qty 1

## 2023-12-10 MED ORDER — DEXAMETHASONE SODIUM PHOSPHATE 10 MG/ML IJ SOLN
10.0000 mg | Freq: Once | INTRAMUSCULAR | Status: AC
  Administered 2023-12-10: 10 mg via INTRAVENOUS
  Filled 2023-12-10: qty 1

## 2023-12-10 MED ORDER — PROPOFOL 1000 MG/100ML IV EMUL
INTRAVENOUS | Status: AC
  Administered 2023-12-10: 20 ug/kg/min via INTRAVENOUS
  Filled 2023-12-10: qty 100

## 2023-12-10 MED ORDER — PROPOFOL 1000 MG/100ML IV EMUL
0.0000 ug/kg/min | INTRAVENOUS | Status: DC
  Administered 2023-12-11 (×2): 40 ug/kg/min via INTRAVENOUS
  Filled 2023-12-10: qty 100

## 2023-12-10 MED ORDER — FENTANYL 2500MCG IN NS 250ML (10MCG/ML) PREMIX INFUSION
INTRAVENOUS | Status: AC
  Administered 2023-12-10: 50 ug/h via INTRAVENOUS
  Filled 2023-12-10: qty 250

## 2023-12-10 MED ORDER — IOHEXOL 300 MG/ML  SOLN
100.0000 mL | Freq: Once | INTRAMUSCULAR | Status: AC | PRN
  Administered 2023-12-10: 75 mL via INTRAVENOUS

## 2023-12-10 MED ORDER — ROCURONIUM BROMIDE 10 MG/ML (PF) SYRINGE
PREFILLED_SYRINGE | INTRAVENOUS | Status: AC
  Filled 2023-12-10: qty 10

## 2023-12-10 NOTE — ED Provider Notes (Signed)
Tanya Armstrong EMERGENCY DEPARTMENT AT [facility] Provider Note   CSN: [id number] Arrival date & time: 12/10/23  1910     History  Chief Complaint  Patient presents with   Abscess    Tanya Armstrong is a 57 y.o. female.  History obtained via [place] interpreter.  57 year old female previously healthy who presents to the emergency department with sore throat.  Patient reports that for the past 2 days she has been having pain in the left side of her neck.  Also has had some low-grade fevers at home.  Seen in urgent care and diagnosed with peritonsillar abscess and told to come into the emergency department.  Is having some voice changes.  Having difficulty swallowing as well.  Denies any shortness of breath to me.       Home Medications Prior to Admission medications   Medication Sig Start Date End Date Taking? Authorizing Provider  calcium-vitamin D (OSCAL WITH D) 500-200 MG-UNIT tablet Take 2 tablets by mouth 3 (three) times daily. 02/02/16   Axel Filler, MD  Cholecalciferol (VITAMIN D PO) Take 1 tablet by mouth daily.    [provider]  cyclobenzaprine (FLEXERIL) 10 MG tablet Take 1 tablet (10 mg total) by mouth at bedtime. 10/21/17   McVey, Madelaine Bhat, PA-C  glucosamine-chondroitin 500-400 MG tablet Take 1 tablet by mouth 3 (three) times daily.    [provider]  levothyroxine (SYNTHROID, LEVOTHROID) 75 MCG tablet Take 1 tablet (75 mcg total) by mouth daily. 06/19/17   Carlus Pavlov, MD  levothyroxine (SYNTHROID, LEVOTHROID) 75 MCG tablet TAKE 1 TABLET BY MOUTH EVERY DAY 05/27/18   Carlus Pavlov, MD  meloxicam (MOBIC) 15 MG tablet TAKE 1 TABLET BY MOUTH EVERY DAY 11/17/17   McVey, Madelaine Bhat, PA-C  Multiple Vitamin (MULTIVITAMIN WITH MINERALS) TABS tablet Take 2 tablets by mouth daily.     [provider]      Allergies    Patient has no known allergies.    Review of Systems   Review of Systems  Physical  Exam Updated Vital Signs BP 113/78   Pulse 99   Temp 98.4 F (36.9 C) (Oral)   Resp 16   Ht 5\' 2"  (1.575 m)   Wt 46.7 kg   LMP  (LMP Unknown)   SpO2 100%   BMI 18.84 kg/m  Physical Exam Vitals and nursing note reviewed.  Constitutional:      General: She is in acute distress.     Appearance: She is well-developed. She is ill-appearing.     Comments: Gurgling respirations.  Significant voice changes.  Difficulty talking.  HENT:     Head: Normocephalic and atraumatic.     Right Ear: External ear normal.     Left Ear: External ear normal.     Nose: Nose normal.     Mouth/Throat:     Comments: Very large mass on soft palate.  Unable to identify uvula or other structures of the posterior pharynx. Eyes:     Extraocular Movements: Extraocular movements intact.     Conjunctiva/sclera: Conjunctivae normal.     Pupils: Pupils are equal, round, and reactive to light.  Cardiovascular:     Rate and Rhythm: Regular rhythm. Tachycardia present.     Heart sounds: No murmur heard. Pulmonary:     Effort: Pulmonary effort is normal. No respiratory distress.     Breath sounds: Normal breath sounds. Stridor present.  Musculoskeletal:     Cervical back: Normal range of motion and neck  supple.     Right lower leg: No edema.     Left lower leg: No edema.  Skin:    General: Skin is warm and dry.  Neurological:     Mental Status: She is alert and oriented to person, place, and time. Mental status is at baseline.  Psychiatric:        Mood and Affect: Mood normal.     ED Results / Procedures / Treatments   Labs (all labs ordered are listed, but only abnormal results are displayed) Labs Reviewed  CBC WITH DIFFERENTIAL/PLATELET - Abnormal; Notable for the following components:      Result Value   WBC 19.3 (*)    Neutro Abs 16.4 (*)    Monocytes Absolute 1.3 (*)    Abs Immature Granulocytes 0.10 (*)    All other components within normal limits  BASIC METABOLIC PANEL - Abnormal; Notable  for the following components:   Glucose, Bld 129 (*)    BUN 23 (*)    All other components within normal limits  I-STAT ARTERIAL BLOOD GAS, ED - Abnormal; Notable for the following components:   pO2, Arterial 434 (*)    All other components within normal limits  CULTURE, BLOOD (ROUTINE X 2)  CULTURE, BLOOD (ROUTINE X 2)  TRIGLYCERIDES  HIV ANTIBODY (ROUTINE TESTING W REFLEX)  LACTIC ACID, PLASMA  LACTIC ACID, PLASMA  CBC  BASIC METABOLIC PANEL  MAGNESIUM  PHOSPHORUS    EKG EKG Interpretation Date/Time:  Wednesday December 10 2023 19:42:05 EST Ventricular Rate:  127 PR Interval:  124 QRS Duration:  92 QT Interval:  304 QTC Calculation: 441 R Axis:   69  Text Interpretation: Sinus tachycardia Right atrial enlargement Incomplete right bundle branch block Borderline ECG No previous ECGs available Confirmed by Vonita Moss [phone]) on 12/10/2023 11:14:52 PM  Radiology CT Soft Tissue Neck W Contrast  Result Date: 12/10/2023 CLINICAL DATA:  Initial evaluation for peritonsillar abscess. EXAM: CT NECK WITH CONTRAST TECHNIQUE: Multidetector CT imaging of the neck was performed using the standard protocol following the bolus administration of intravenous contrast. RADIATION DOSE REDUCTION: This exam was performed according to the departmental dose-optimization program which includes automated exposure control, adjustment of the mA and/or kV according to patient size and/or use of iterative reconstruction technique. CONTRAST:  75mL OMNIPAQUE IOHEXOL 300 MG/ML  SOLN COMPARISON:  None Available. FINDINGS: Pharynx and larynx: Endotracheal and enteric tubes in place. The enteric tube courses superiorly into the posterior nasal passages/nasopharynx before coursing inferiorly into the esophagus. Oral cavity itself within normal limits. Enlargement of the left palatine tonsil, consistent with acute tonsillitis. Superimposed heterogeneous rim enhancing collection measuring approximately 3.3 x 2.6 x  4.2 cm in greatest dimensions, consistent with tonsillar/peritonsillar abscess (series 2, image 42). Abscess extends superiorly towards the nasopharynx and soft palate. Associated swelling with hazy inflammatory stranding within the adjacent left parapharyngeal and submandibular spaces. No retropharyngeal collection. Epiglottis itself is effaced and not seen. Remainder of the hypopharynx and supraglottic larynx grossly within normal limits. Glottis grossly symmetric and normal. Subglottic airway clear. Tip of the ET tube terminates above the carina. Salivary glands: Salivary glands including the parotid and submandibular glands are within normal limits. Thyroid: Postoperative changes noted about the thyroid. Residual 1.5 cm right thyroid nodule (series 2, image 89). This has been previously evaluated by thyroid ultrasound and biopsy, most recently on 11/09/2015. Please refer to these examinations for any potential follow-up recommendations. Lymph nodes: Mild asymmetric prominence of left upper cervical lymph nodes, largest  of which seen at level 2 and measures 1 cm in short axis, presumably reactive. Vascular: Normal intravascular enhancement seen within the neck. Proximal left IJ is partially compressed due to the inflammatory process within the left neck. Limited intracranial: Unremarkable. Visualized orbits: Unremarkable. Mastoids and visualized paranasal sinuses: Scattered mucosal thickening present about the ethmoidal air cells and maxillary sinuses. Paranasal sinuses are otherwise clear. Mastoid air cells and middle ear cavities are well pneumatized and free of fluid. Skeleton: No discrete or worrisome osseous lesions. Upper chest: No other acute finding. Other: None. IMPRESSION: 1. Findings consistent with acute left-sided tonsillitis with superimposed 3.3 x 2.6 x 4.2 cm tonsillar/peritonsillar abscess. 2. Mild asymmetric prominence of left upper cervical lymph nodes, presumably reactive. 3. Endotracheal and  enteric tubes in place. The enteric tube courses superiorly into the posterior right nasal cavity before coursing inferiorly into the esophagus. 4. 1.5 cm right thyroid nodule (series 2, image 89). This has been previously evaluated by thyroid ultrasound and biopsy, most recently on 11/09/2015. Please refer to these examinations for any potential follow-up recommendations regarding this finding. Electronically Signed   By: Rise Mu M.D.   On: 12/10/2023 22:13   DG Chest Portable 1 View  Result Date: 12/10/2023 CLINICAL DATA:  Intubation EXAM: PORTABLE CHEST 1 VIEW COMPARISON:  10/03/2016 FINDINGS: Endotracheal tube is 3.5 cm above the carina. NG tube is in the stomach. Heart and mediastinal contours are within normal limits. No focal opacities or effusions. No acute bony abnormality. IMPRESSION: Support devices in expected position. No active cardiopulmonary disease. Electronically Signed   By: Charlett Nose M.D.   On: 12/10/2023 21:21    Procedures Procedure Name: Intubation Date/Time: 12/10/2023 9:24 PM  Performed by: Rondel Baton, MDPre-anesthesia Checklist: Patient identified Oxygen Delivery Method: Nasal cannula Preoxygenation: Pre-oxygenation with 100% oxygen Induction Type: IV induction and Rapid sequence Laryngoscope Size: Glidescope and 3 Grade View: Grade I Tube size: 7.0 mm Number of attempts: 1 Airway Equipment and Method: Rigid stylet and Video-laryngoscopy Placement Confirmation: ETT inserted through vocal cords under direct vision, Positive ETCO2 and CO2 detector Secured at: 22 cm Tube secured with: ETT holder Dental Injury: Teeth and Oropharynx as per pre-operative assessment  Difficulty Due To: Difficulty was anticipated        Medications Ordered in ED Medications  succinylcholine (ANECTINE) 200 MG/10ML syringe (  Not Given 12/10/23 2132)  heparin injection 5,000 Units (has no administration in time range)  pantoprazole (PROTONIX) injection 40 mg  (40 mg Intravenous Given 12/10/23 2259)  insulin aspart (novoLOG) injection 0-9 Units (has no administration in time range)  docusate (COLACE) 50 MG/5ML liquid 100 mg (100 mg Per Tube Given 12/10/23 2259)  polyethylene glycol (MIRALAX / GLYCOLAX) packet 17 g (has no administration in time range)  fentaNYL (SUBLIMAZE) injection 50 mcg (has no administration in time range)  fentaNYL (SUBLIMAZE) injection 50-200 mcg (has no administration in time range)  propofol (DIPRIVAN) 1000 MG/100ML infusion (20 mcg/kg/min  46.7 kg Intravenous New Bag/Given 12/10/23 2044)  levothyroxine (SYNTHROID) tablet 75 mcg (has no administration in time range)  piperacillin-tazobactam (ZOSYN) IVPB 3.375 g (0 g Intravenous Stopped 12/10/23 2127)  dexamethasone (DECADRON) injection 10 mg (10 mg Intravenous Given 12/10/23 2016)  etomidate (AMIDATE) 2 MG/ML injection (25 mg  Given 12/10/23 2034)  rocuronium (ZEMURON) 100 MG/10ML injection (  Given 12/10/23 2034)  fentaNYL 10 mcg/ml infusion (75 mcg/hr  Rate/Dose Change 12/10/23 2248)  iohexol (OMNIPAQUE) 300 MG/ML solution 100 mL (75 mLs Intravenous Contrast Given 12/10/23 2107)  ED Course/ Medical Decision Making/ A&P Clinical Course as of 12/10/23 2341  Wed Dec 10, 2023  2013 Dr Jearld Fenton consulted.  Has evaluated the patient in the emergency department.  Recommends admission to ICU at [facility] if the patient has peritonsillar abscess. [RP]  2158 Dr Merrily Pew from the ICU to admit [RP]    Clinical Course User Index [RP] Rondel Baton, MD                                 Medical Decision Making Amount and/or Complexity of Data Reviewed Labs: ordered. Radiology: ordered.  Risk Prescription drug management. Decision regarding hospitalization.   Tanya Armstrong is a 57 y.o. female who presented to the emergency department with neck pain and low-grade fevers at home with difficulty speaking and tolerating her secretions  Initial Ddx:  Peritonsillar abscess,  epiglottitis, RPA, airway compromise  MDM/Course:  Patient presented to the emergency department with concerns for peritonsillar abscess.  Unfortunately on exam did have some concerns for airway compromise.  She was having gurgling respirations.  She also was having some difficulty speaking and difficulty tolerating her secretions.  Was intubated for airway protection.  CT scan showed a large peritonsillar abscess.  ENT will drainage in the OR tomorrow morning.  Admitted to the ICU for further management.  Patient given Zosyn.  Upon re-evaluation the patient remained stable.  This patient presents to the ED for concern of complaints listed in HPI, this involves an extensive number of treatment options, and is a complaint that carries with it a high risk of complications and morbidity. Disposition including potential need for admission considered.   Dispo: ICU  Additional history obtained from spouse The following labs were independently interpreted: CBC and show  leukocytosis I independently reviewed the following imaging with scope of interpretation limited to determining acute life threatening conditions related to emergency care:  CT neck  and agree with the radiologist interpretation with the following exceptions: none I personally reviewed and interpreted cardiac monitoring: normal sinus rhythm  I personally reviewed and interpreted the pt's EKG: see above for interpretation  I have reviewed the patients home medications and made adjustments as needed Consults:  ENT Social Determinants of health:  [place] speaking  Portions of this note were generated with Scientist, clinical (histocompatibility and immunogenetics). Dictation errors may occur despite best attempts at proofreading.    CRITICAL CARE Performed by: Rondel Baton   Total critical care time: 45 minutes  Critical care time was exclusive of separately billable procedures and treating other patients.  Critical care was necessary to treat or prevent  imminent or life-threatening deterioration.  Critical care was time spent personally by me on the following activities: development of treatment plan with patient and/or surrogate as well as nursing, discussions with consultants, evaluation of patient's response to treatment, examination of patient, obtaining history from patient or surrogate, ordering and performing treatments and interventions, ordering and review of laboratory studies, ordering and review of radiographic studies, pulse oximetry and re-evaluation of patient's condition.   Final Clinical Impression(s) / ED Diagnoses Final diagnoses:  Peritonsillar abscess  Airway compromise    Rx / DC Orders ED Discharge Orders     None         Rondel Baton, MD 12/10/23 2341

## 2023-12-10 NOTE — ED Notes (Signed)
Patient's VS WNL, safety measures in check, family at bedside aware that we are waiting on transportation to [facility]. Family denies any questions.

## 2023-12-10 NOTE — ED Notes (Signed)
ETT advanced to 22 @ teeth per EDP following CXR

## 2023-12-10 NOTE — ED Notes (Signed)
No change in patient's status at this time. Safety measures in check. Report given to [facility].

## 2023-12-10 NOTE — ED Notes (Signed)
Pt transported to CT at this time.

## 2023-12-10 NOTE — ED Triage Notes (Addendum)
Patient presents to ED from [facility] for peritonsillar abscess and fever. Patient reports noticing the abscess two day ago. Patient reports pain with swallowing and being unable to eat. Swelling noted to left side of jaw. No drooling noted, hoarse voice noted, airway intact. CN notified about patient.

## 2023-12-10 NOTE — ED Notes (Signed)
MD at bedside. 

## 2023-12-10 NOTE — Sedation Documentation (Signed)
ENT provider at bedside at this time.

## 2023-12-10 NOTE — ED Notes (Signed)
Attempted report at this time. RN unavailable and will call back to receive report.

## 2023-12-10 NOTE — ED Notes (Signed)
FiO2 titrated to 40%

## 2023-12-10 NOTE — ED Notes (Signed)
Patient transported to CT at this time with RN staff and RT.

## 2023-12-10 NOTE — H&P (Signed)
NAME:  Tanya Armstrong, MRN:  [id number], DOB:  [birth year], LOS: 0 ADMISSION DATE:  12/10/2023 CONSULTATION DATE:  12/11/2023 REFERRING MD:  Eloise Harman - EDP CHIEF COMPLAINT:  Peritonsillar abscess   History of Present Illness:  57 year old woman who presented to [facility] 12/11 for fever and peritonsillar abscess. PMHx significant for hypothyroidism.  Patient is intubated, therefore history is obtained primarily from chart review. Patient reportedly presented to Urgent Care 12/11 for a 2-day history of sore throat, pain with swallowing and inability to eat. Left-sided jaw swelling was noted as well as hoarseness. +Low grade fevers noted at home. Peritonsillar abscess was suspected and she was advised to proceed to ED for further care. At the time of presentation to [facility] ED, patient was having significant voice changes, gurgling respirations and difficulty talking with development of stridor. Ultimately, decision was made to intubate patient for airway protection.  Labs were notable for WBC 19.3, Hgb/Plt WNL. BMP was grossly unremarkable. Blood cultures were obtained and pending; broad-spectrum Zosyn was given as well as Decadron for swelling. Post-intubation ABG was WNL.  CT Neck was completed at [facility] demonstrating acute L-sided tonsillitis with superimposed tonsillar/peritonsillar abscess (3.3 x 2.6 x 4.2cm) with asymmetric L upper cervical LN. ENT (Dr. Jearld Fenton) was consulted with plan for OR 12/12 for management of abscess  PCCM consulted for ICU admission to [facility]  Pertinent Medical History:   Past Medical History:  Diagnosis Date   Abdominal bloating    Cough    Dry throat    FUO (fever of unknown origin)    H/O one miscarriage    Headache    Multiple thyroid nodules    Thyroid cyst    Significant Hospital Events: Including procedures, antibiotic start and stop dates in addition to other pertinent events   12/11 - Presented to [facility] from urgent care for fever and peritonsillar  abscess. Intubated for airway protection. 12/12 - Transferred to [facility] for higher level of care.  Interim History / Subjective:  PCCM consulted for ICU admission in the setting of intubation.  Objective:  Blood pressure 117/75, pulse 88, temperature 98.6 F (37 C), temperature source Oral, resp. rate 16, height 5\' 2"  (1.575 m), weight 46.7 kg, SpO2 99%.    Vent Mode: PRVC FiO2 (%):  [40 %-100 %] 40 % Set Rate:  [16 bmp] 16 bmp Vt Set:  [400 mL] 400 mL PEEP:  [5 cmH20] 5 cmH20 Plateau Pressure:  [15 cmH20] 15 cmH20   Intake/Output Summary (Last 24 hours) at 12/11/2023 0229 Last data filed at 12/11/2023 0001 Gross per 24 hour  Intake 100 ml  Output 200 ml  Net -100 ml   Filed Weights   12/10/23 1936  Weight: 46.7 kg   Physical Examination: General: Acutely ill-appearing middle-aged woman in NAD. HEENT: [place]/AT, anicteric sclera, PERRL, moist mucous membranes. Neck: L neck/lower jaw swelling/fullness, mildly TTP, no erythema. Neuro:  Drowsy, but nodding appropriately to questions. Wakes easily to voice.  Responds to verbal stimuli. Following commands consistently. Moves all 4 extremities spontaneously. +Cough and +Gag  CV: RRR, no m/g/r. PULM: Breathing even and unlabored on vent (PEEP 5, FiO2 40%). Lung fields CTAB. GI: Soft, nontender, nondistended. Normoactive bowel sounds. Extremities: No LE edema noted. Skin: Warm/dry, no rashes.  Resolved Hospital Problem List:    Assessment & Plan:  Peritonsillar abscess with threatened airway CT Neck/Soft Tissue 12/11 with acute L-sided tonsillitis with superimposed tonsillar/peritonsillar abscess (3.3 x 2.6 x 4.2cm) with asymmetric L upper cervical LN. -  Admit to [facility] ICU - ENT consulted, appreciate recommendations - Tentative plan for OR 12/12 for management of peritonsillar abscess - Trend WBC, fever curve, LA - F/u Cx data - Continue broad-spectrum antibiotics (Zosyn) - Consider additional Decadron  Respiratory insufficiency  in the setting of peritonsillar abscess, need for airway protection - Continue full vent support (4-8cc/kg IBW) - Wean FiO2 for O2 sat > 90% - Daily WUA/SBT, rate-limiting factor with be abscess management not respiratory status - VAP bundle - Pulmonary hygiene - PAD protocol for sedation: Propofol and Fentanyl for goal RASS 0 to -1  Hypothyroidism - Continue home levothyroxine  Best Practice: (right click and "Reselect all SmartList Selections" daily)   Diet/type: NPO DVT prophylaxis: SCDs, SQH GI prophylaxis: PPI Lines: N/A Foley:  Yes, and it is still needed Code Status:  full code Last date of multidisciplinary goals of care discussion [Pending]  Labs:  CBC: Recent Labs  Lab 12/10/23 1946 12/10/23 2200 12/11/23 0143  WBC 19.3*  --  19.8*  NEUTROABS 16.4*  --   --   HGB 13.4 12.2 12.4  HCT 41.0 36.0 37.4  MCV 91.7  --  91.7  PLT 254  --  223   Basic Metabolic Panel: Recent Labs  Lab 12/10/23 1946 12/10/23 2200  NA 141 138  K 4.1 3.7  CL 101  --   CO2 26  --   GLUCOSE 129*  --   BUN 23*  --   CREATININE 0.59  --   CALCIUM 10.0  --    GFR: Estimated Creatinine Clearance: 56.5 mL/min (by C-G formula based on SCr of 0.59 mg/dL). Recent Labs  Lab 12/10/23 1946 12/11/23 0143  WBC 19.3* 19.8*  LATICACIDVEN  --  0.9   Liver Function Tests: No results for input(s): "AST", "ALT", "ALKPHOS", "BILITOT", "PROT", "ALBUMIN" in the last 168 hours. No results for input(s): "LIPASE", "AMYLASE" in the last 168 hours. No results for input(s): "AMMONIA" in the last 168 hours.  ABG:    Component Value Date/Time   PHART 7.430 12/10/2023 2200   PCO2ART 34.9 12/10/2023 2200   PO2ART 434 (H) 12/10/2023 2200   HCO3 23.2 12/10/2023 2200   TCO2 24 12/10/2023 2200   ACIDBASEDEF 1.0 12/10/2023 2200   O2SAT 100 12/10/2023 2200   Coagulation Profile: No results for input(s): "INR", "PROTIME" in the last 168 hours.  Cardiac Enzymes: No results for input(s): "CKTOTAL",  "CKMB", "CKMBINDEX", "TROPONINI" in the last 168 hours.  HbA1C: No results found for: "HGBA1C"  CBG: Recent Labs  Lab 12/11/23 0057  GLUCAP 175*    Review of Systems:   Patient is encephalopathic and/or intubated; therefore, history has been obtained from chart review.   Past Medical History:  She,  has a past medical history of Abdominal bloating, Cough, Dry throat, FUO (fever of unknown origin), H/O one miscarriage, Headache, Multiple thyroid nodules, and Thyroid cyst.   Surgical History:   Past Surgical History:  Procedure Laterality Date   DILATION AND CURETTAGE OF UTERUS     THYROIDECTOMY N/A 02/02/2016   Procedure: TOTAL THYROIDECTOMY;  Surgeon: Axel Filler, MD;  Location: [facility];  Service: General;  Laterality: N/A;   Social History:   reports that she has never smoked. She has never used smokeless tobacco. She reports that she does not drink alcohol and does not use drugs.   Family History:  Her family history is not on file.   Allergies: No Known Allergies   Home Medications: Prior to Admission medications  Medication Sig Start Date End Date Taking? Authorizing Provider  calcium-vitamin D (OSCAL WITH D) 500-200 MG-UNIT tablet Take 2 tablets by mouth 3 (three) times daily. 02/02/16   Axel Filler, MD  Cholecalciferol (VITAMIN D PO) Take 1 tablet by mouth daily.    [provider]  cyclobenzaprine (FLEXERIL) 10 MG tablet Take 1 tablet (10 mg total) by mouth at bedtime. 10/21/17   McVey, Madelaine Bhat, PA-C  glucosamine-chondroitin 500-400 MG tablet Take 1 tablet by mouth 3 (three) times daily.    [provider]  levothyroxine (SYNTHROID, LEVOTHROID) 75 MCG tablet Take 1 tablet (75 mcg total) by mouth daily. 06/19/17   Carlus Pavlov, MD  levothyroxine (SYNTHROID, LEVOTHROID) 75 MCG tablet TAKE 1 TABLET BY MOUTH EVERY DAY 05/27/18   Carlus Pavlov, MD  meloxicam (MOBIC) 15 MG tablet TAKE 1 TABLET BY MOUTH EVERY DAY 11/17/17   McVey,  Madelaine Bhat, PA-C  Multiple Vitamin (MULTIVITAMIN WITH MINERALS) TABS tablet Take 2 tablets by mouth daily.     [provider]    Critical care time:    The patient is critically ill with multiple organ system failure and requires high complexity decision making for assessment and support, frequent evaluation and titration of therapies, advanced monitoring, review of radiographic studies and interpretation of complex data.   Critical Care Time devoted to patient care services, exclusive of separately billable procedures, described in this note is 39 minutes.  Tim Lair, PA-C [facility] 12/11/23 2:29 AM  Please see [email or web address] for pager details.  From 7A-7P if no response, please call [phone] After hours, please call ELink [phone]

## 2023-12-10 NOTE — ED Notes (Signed)
FiO2 titrated to 60% at this time.

## 2023-12-10 NOTE — ED Notes (Signed)
Patient transported from CT to Room 16 with RN staff and RT. Patient maintained on cardiac monitoring during transport. Patient tolerated transport well.

## 2023-12-11 ENCOUNTER — Inpatient Hospital Stay (HOSPITAL_COMMUNITY): Payer: BC Managed Care – PPO | Admitting: Anesthesiology

## 2023-12-11 ENCOUNTER — Encounter (HOSPITAL_COMMUNITY)
Admission: EM | Disposition: A | Discharge status: Home / Self Care | Payer: Self-pay | Source: Home / Self Care | Attending: Pulmonary Disease

## 2023-12-11 ENCOUNTER — Other Ambulatory Visit: Payer: Self-pay

## 2023-12-11 DIAGNOSIS — B954 Other streptococcus as the cause of diseases classified elsewhere: Secondary | ICD-10-CM | POA: Diagnosis present

## 2023-12-11 DIAGNOSIS — K0510 Chronic gingivitis, plaque induced: Secondary | ICD-10-CM | POA: Diagnosis present

## 2023-12-11 DIAGNOSIS — J36 Peritonsillar abscess: Secondary | ICD-10-CM

## 2023-12-11 DIAGNOSIS — E039 Hypothyroidism, unspecified: Secondary | ICD-10-CM | POA: Insufficient documentation

## 2023-12-11 DIAGNOSIS — R9431 Abnormal electrocardiogram [ECG] [EKG]: Secondary | ICD-10-CM | POA: Diagnosis not present

## 2023-12-11 DIAGNOSIS — E890 Postprocedural hypothyroidism: Secondary | ICD-10-CM | POA: Diagnosis present

## 2023-12-11 DIAGNOSIS — Z79899 Other long term (current) drug therapy: Secondary | ICD-10-CM | POA: Diagnosis not present

## 2023-12-11 DIAGNOSIS — J988 Other specified respiratory disorders: Secondary | ICD-10-CM | POA: Insufficient documentation

## 2023-12-11 DIAGNOSIS — E041 Nontoxic single thyroid nodule: Secondary | ICD-10-CM | POA: Diagnosis present

## 2023-12-11 DIAGNOSIS — Z79890 Hormone replacement therapy (postmenopausal): Secondary | ICD-10-CM | POA: Diagnosis not present

## 2023-12-11 DIAGNOSIS — J9600 Acute respiratory failure, unspecified whether with hypoxia or hypercapnia: Secondary | ICD-10-CM | POA: Diagnosis present

## 2023-12-11 HISTORY — PX: TONSILLECTOMY AND ADENOIDECTOMY: SHX28

## 2023-12-11 LAB — HIV ANTIBODY (ROUTINE TESTING W REFLEX): HIV Screen 4th Generation wRfx: NONREACTIVE

## 2023-12-11 LAB — BASIC METABOLIC PANEL
Anion gap: 14 (ref 5–15)
BUN: 23 mg/dL — ABNORMAL HIGH (ref 6–20)
CO2: 20 mmol/L — ABNORMAL LOW (ref 22–32)
Calcium: 9.2 mg/dL (ref 8.9–10.3)
Chloride: 104 mmol/L (ref 98–111)
Creatinine, Ser: 0.72 mg/dL (ref 0.44–1.00)
GFR, Estimated: >60 mL/min (ref >60)
Glucose, Bld: 179 mg/dL — ABNORMAL HIGH (ref 70–99)
Potassium: 3.8 mmol/L (ref 3.5–5.1)
Sodium: 138 mmol/L (ref 135–145)

## 2023-12-11 LAB — CBC
HCT: 37.4 % (ref 36.0–46.0)
Hemoglobin: 12.4 g/dL (ref 12.0–15.0)
MCH: 30.4 pg (ref 26.0–34.0)
MCHC: 33.2 g/dL (ref 30.0–36.0)
MCV: 91.7 fL (ref 80.0–100.0)
Platelets: 223 10*3/uL (ref 150–400)
RBC: 4.08 MIL/uL (ref 3.87–5.11)
RDW: 12.3 % (ref 11.5–15.5)
WBC: 19.8 10*3/uL — ABNORMAL HIGH (ref 4.0–10.5)
nRBC: 0 % (ref 0.0–0.2)

## 2023-12-11 LAB — GLUCOSE, CAPILLARY
Glucose-Capillary: 175 mg/dL — ABNORMAL HIGH (ref 70–99)
Glucose-Capillary: 194 mg/dL — ABNORMAL HIGH (ref 70–99)
Glucose-Capillary: 149 mg/dL — ABNORMAL HIGH (ref 70–99)
Glucose-Capillary: 135 mg/dL — ABNORMAL HIGH (ref 70–99)
Glucose-Capillary: 196 mg/dL — ABNORMAL HIGH (ref 70–99)
Glucose-Capillary: 162 mg/dL — ABNORMAL HIGH (ref 70–99)
Glucose-Capillary: 175 mg/dL — ABNORMAL HIGH (ref 70–99)

## 2023-12-11 LAB — TRIGLYCERIDES: Triglycerides: 92 mg/dL (ref <150)

## 2023-12-11 LAB — MAGNESIUM: Magnesium: 2.4 mg/dL (ref 1.7–2.4)

## 2023-12-11 LAB — MRSA NEXT GEN BY PCR, NASAL: MRSA by PCR Next Gen: NOT DETECTED

## 2023-12-11 LAB — PHOSPHORUS: Phosphorus: 3.3 mg/dL (ref 2.5–4.6)

## 2023-12-11 LAB — LACTIC ACID, PLASMA: Lactic Acid, Venous: 0.9 mmol/L (ref 0.5–1.9)

## 2023-12-11 SURGERY — TONSILLECTOMY AND ADENOIDECTOMY
Anesthesia: General

## 2023-12-11 MED ORDER — ONDANSETRON HCL 4 MG/2ML IJ SOLN
INTRAMUSCULAR | Status: DC | PRN
  Administered 2023-12-11: 4 mg via INTRAVENOUS

## 2023-12-11 MED ORDER — FENTANYL CITRATE (PF) 250 MCG/5ML IJ SOLN
INTRAMUSCULAR | Status: DC | PRN
  Administered 2023-12-11: 75 ug via INTRAVENOUS

## 2023-12-11 MED ORDER — ROCURONIUM BROMIDE 10 MG/ML (PF) SYRINGE
PREFILLED_SYRINGE | INTRAVENOUS | Status: AC
Start: 2023-12-11 — End: ?
  Filled 2023-12-11: qty 10

## 2023-12-11 MED ORDER — CHLORHEXIDINE GLUCONATE CLOTH 2 % EX PADS
6.0000 | MEDICATED_PAD | Freq: Every day | CUTANEOUS | Status: DC
Start: 2023-12-11 — End: 2023-12-12
  Administered 2023-12-11 – 2023-12-12 (×2): 6 via TOPICAL

## 2023-12-11 MED ORDER — LACTATED RINGERS IV SOLN: INTRAVENOUS | Status: DC

## 2023-12-11 MED ORDER — SODIUM CHLORIDE 0.9 % IV SOLN
3.0000 g | Freq: Four times a day (QID) | INTRAVENOUS | Status: DC
  Administered 2023-12-11 – 2023-12-12 (×4): 3 g via INTRAVENOUS
  Filled 2023-12-11 (×4): qty 8

## 2023-12-11 MED ORDER — SODIUM CHLORIDE 0.9 % IV SOLN: INTRAVENOUS | Status: DC | PRN

## 2023-12-11 MED ORDER — SODIUM CHLORIDE 0.9 % IR SOLN
Status: DC | PRN
  Administered 2023-12-11: 1000 mL

## 2023-12-11 MED ORDER — EPHEDRINE 5 MG/ML INJ
INTRAVENOUS | Status: AC
  Filled 2023-12-11: qty 5

## 2023-12-11 MED ORDER — ORAL CARE MOUTH RINSE: 15.0000 mL | OROMUCOSAL | Status: DC | PRN

## 2023-12-11 MED ORDER — FENTANYL CITRATE (PF) 250 MCG/5ML IJ SOLN
INTRAMUSCULAR | Status: AC
  Filled 2023-12-11: qty 5

## 2023-12-11 MED ORDER — PIPERACILLIN-TAZOBACTAM 3.375 G IVPB
3.3750 g | Freq: Three times a day (TID) | INTRAVENOUS | Status: DC
  Administered 2023-12-11: 3.375 g via INTRAVENOUS
  Filled 2023-12-11: qty 50

## 2023-12-11 MED ORDER — ORAL CARE MOUTH RINSE
15.0000 mL | OROMUCOSAL | Status: DC
  Administered 2023-12-11 (×5): 15 mL via OROMUCOSAL

## 2023-12-11 MED ORDER — ROCURONIUM BROMIDE 10 MG/ML (PF) SYRINGE
PREFILLED_SYRINGE | INTRAVENOUS | Status: DC | PRN
  Administered 2023-12-11: 15 mg via INTRAVENOUS

## 2023-12-11 MED ORDER — SUGAMMADEX SODIUM 200 MG/2ML IV SOLN
INTRAVENOUS | Status: DC | PRN
  Administered 2023-12-11 (×2): 100 mg via INTRAVENOUS

## 2023-12-11 MED ORDER — OXYMETAZOLINE HCL 0.05 % NA SOLN
NASAL | Status: AC
Start: 2023-12-11 — End: ?
  Filled 2023-12-11: qty 30

## 2023-12-11 MED ORDER — ONDANSETRON HCL 4 MG/2ML IJ SOLN
INTRAMUSCULAR | Status: AC
  Filled 2023-12-11: qty 2

## 2023-12-11 MED ORDER — EPINEPHRINE HCL (NASAL) 0.1 % NA SOLN
NASAL | Status: AC
Start: 2023-12-11 — End: ?
  Filled 2023-12-11: qty 30

## 2023-12-11 MED ORDER — DEXAMETHASONE SODIUM PHOSPHATE 10 MG/ML IJ SOLN
INTRAMUSCULAR | Status: DC | PRN
  Administered 2023-12-11: 2 mg via INTRAVENOUS

## 2023-12-11 MED ORDER — METHYLPREDNISOLONE SODIUM SUCC 40 MG IJ SOLR
40.0000 mg | Freq: Two times a day (BID) | INTRAMUSCULAR | Status: DC
  Administered 2023-12-11 – 2023-12-12 (×2): 40 mg via INTRAVENOUS
  Filled 2023-12-11 (×2): qty 1

## 2023-12-11 MED ORDER — DEXAMETHASONE SODIUM PHOSPHATE 10 MG/ML IJ SOLN
INTRAMUSCULAR | Status: AC
  Filled 2023-12-11: qty 1

## 2023-12-11 MED ORDER — ORAL CARE MOUTH RINSE
15.0000 mL | OROMUCOSAL | Status: DC
Start: 2023-12-11 — End: 2023-12-11

## 2023-12-11 MED ORDER — PHENYLEPHRINE 80 MCG/ML (10ML) SYRINGE FOR IV PUSH (FOR BLOOD PRESSURE SUPPORT)
PREFILLED_SYRINGE | INTRAVENOUS | Status: AC
  Filled 2023-12-11: qty 10

## 2023-12-11 SURGICAL SUPPLY — 32 items
BAG COUNTER SPONGE SURGICOUNT (BAG) ×1 IMPLANT
CANISTER SUCT 3000ML PPV (MISCELLANEOUS) ×1 IMPLANT
CATH ROBINSON RED A/P 10FR (CATHETERS) ×1 IMPLANT
CLEANER TIP ELECTROSURG 2X2 (MISCELLANEOUS) ×1 IMPLANT
COAGULATOR SUCT SWTCH 10FR 6 (ELECTROSURGICAL) ×1 IMPLANT
DRAPE HALF SHEET 40X57 (DRAPES) IMPLANT
ELECT COATED BLADE 2.86 ST (ELECTRODE) ×1 IMPLANT
ELECT REM PT RETURN 9FT ADLT (ELECTROSURGICAL) IMPLANT
ELECT REM PT RETURN 9FT PED (ELECTROSURGICAL) IMPLANT
ELECTRODE REM PT RETRN 9FT PED (ELECTROSURGICAL) IMPLANT
ELECTRODE REM PT RTRN 9FT ADLT (ELECTROSURGICAL) IMPLANT
GAUZE 4X4 16PLY ~~LOC~~+RFID DBL (SPONGE) ×1 IMPLANT
GLOVE SS BIOGEL STRL SZ 7.5 (GLOVE) ×1 IMPLANT
GOWN STRL REUS W/ TWL LRG LVL3 (GOWN DISPOSABLE) ×2 IMPLANT
KIT BASIN OR (CUSTOM PROCEDURE TRAY) ×1 IMPLANT
KIT TURNOVER KIT B (KITS) ×1 IMPLANT
NDL HYPO 25GX1X1/2 BEV (NEEDLE) IMPLANT
NEEDLE HYPO 25GX1X1/2 BEV (NEEDLE) IMPLANT
NS IRRIG 1000ML POUR BTL (IV SOLUTION) ×1 IMPLANT
PACK SRG BSC III STRL LF ECLPS (CUSTOM PROCEDURE TRAY) ×1 IMPLANT
PAD ARMBOARD 7.5X6 YLW CONV (MISCELLANEOUS) ×2 IMPLANT
PENCIL FOOT CONTROL (ELECTRODE) ×1 IMPLANT
POSITIONER HEAD DONUT 9IN (MISCELLANEOUS) IMPLANT
SPECIMEN JAR SMALL (MISCELLANEOUS) IMPLANT
SPONGE TONSIL 1 RF SGL (DISPOSABLE) IMPLANT
SYR BULB EAR ULCER 3OZ GRN STR (SYRINGE) ×1 IMPLANT
TOWEL GREEN STERILE FF (TOWEL DISPOSABLE) ×2 IMPLANT
TUBE CONNECTING 12X1/4 (SUCTIONS) ×1 IMPLANT
TUBE SALEM SUMP 10F (TUBING) IMPLANT
TUBE SALEM SUMP 12F (TUBING) ×1 IMPLANT
TUBE SALEM SUMP 16F (TUBING) IMPLANT
WATER STERILE IRR 1000ML POUR (IV SOLUTION) ×1 IMPLANT

## 2023-12-11 NOTE — Plan of Care (Signed)
  Problem: Education: Goal: Knowledge of General Education information will improve Description: Including pain rating scale, medication(s)/side effects and non-pharmacologic comfort measures Outcome: Progressing   Problem: Health Behavior/Discharge Planning: Goal: Ability to manage health-related needs will improve Outcome: Progressing   Problem: Clinical Measurements: Goal: Ability to maintain clinical measurements within normal limits will improve Outcome: Progressing Goal: Will remain free from infection Outcome: Progressing Goal: Diagnostic test results will improve Outcome: Progressing Goal: Respiratory complications will improve Outcome: Progressing Goal: Cardiovascular complication will be avoided Outcome: Progressing   Problem: Activity: Goal: Risk for activity intolerance will decrease Outcome: Progressing   Problem: Coping: Goal: Level of anxiety will decrease Outcome: Progressing   Problem: Elimination: Goal: Will not experience complications related to bowel motility Outcome: Progressing Goal: Will not experience complications related to urinary retention Outcome: Progressing   Problem: Pain Management: Goal: General experience of comfort will improve Outcome: Progressing   Problem: Safety: Goal: Ability to remain free from injury will improve Outcome: Progressing   Problem: Skin Integrity: Goal: Risk for impaired skin integrity will decrease Outcome: Progressing   Problem: Coping: Goal: Ability to adjust to condition or change in health will improve Outcome: Progressing   Problem: Fluid Volume: Goal: Ability to maintain a balanced intake and output will improve Outcome: Progressing   Problem: Health Behavior/Discharge Planning: Goal: Ability to identify and utilize available resources and services will improve Outcome: Progressing Goal: Ability to manage health-related needs will improve Outcome: Progressing   Problem: Metabolic: Goal:  Ability to maintain appropriate glucose levels will improve Outcome: Progressing   Problem: Skin Integrity: Goal: Risk for impaired skin integrity will decrease Outcome: Progressing   Problem: Tissue Perfusion: Goal: Adequacy of tissue perfusion will improve Outcome: Progressing   Problem: Nutrition: Goal: Adequate nutrition will be maintained Outcome: Not Progressing   Problem: Nutritional: Goal: Maintenance of adequate nutrition will improve Outcome: Not Progressing Goal: Progress toward achieving an optimal weight will improve Outcome: Not Progressing

## 2023-12-11 NOTE — Progress Notes (Signed)
Initial Nutrition Assessment  DOCUMENTATION CODES:  Underweight  INTERVENTION:  If unable to be extubated, recommend initiation of the following: Tube feeding via OGT: Osmolite 1.2 at 55 ml/h (1320 ml per day) Start at 25 and advance by 10mL q8h to goal of 55 Provides 1584 kcal, 73 gm protein, 1082 ml free water daily  NUTRITION DIAGNOSIS:  Increased nutrient needs related to acute illness (tonsill infection) as evidenced by estimated needs.  GOAL:  Patient will meet greater than or equal to 90% of their needs  MONITOR:  Vent status, Labs, I & O's  REASON FOR ASSESSMENT:  Ventilator    ASSESSMENT:  Pt with no medical hx presented to ED from Urgent Care with a fever and peritonsillar abscess.  12/11 - presented to ED, intubated   Patient is currently intubated on ventilator support. OGT in place noted to be in the stomach per imaging. Per ENT - ok to be extubated from anatomical standpoint, just waiting on mental status to improve.   Son in room at the time of assessment able to provide a nutrition hx. States that pt was in her usual state of health until about 2 days prior to admission. Pt thought that she just had a normal sore throat but that it worsened to the point that her spouse took her to the MD and then was sent to the ED. States that pt has a good appetite and eats 3 meals a day but that portions are small. Reports that she has always been thin but that weight is stable. Does endorse possible weight loss over the last few days as pt was unable to tolerate PO.   On exam, pt is thin with loss of muscle and fat deficits. However, based on pt's normal intake and weight, feel this is likely due to genetics versus true malnutrition. Will monitor weights this admission to ensue that pt does not develop malnutrition acutely.   MV: 6.4 L/min Temp (24hrs), Avg:99.1 F (37.3 C), Min:98.4 F (36.9 C), Max:99.9 F (37.7 C)  Propofol: 11.21 ml/hr (296 kcal/d)  Admit weight:  46.7 kg  Current weight: 45 kg   Intake/Output Summary (Last 24 hours) at 12/11/2023 1446 Last data filed at 12/11/2023 1400 Gross per 24 hour  Intake 677.38 ml  Output 725 ml  Net -47.62 ml  Net IO Since Admission: -47.62 mL [12/11/23 1446]  Drains/Lines: OGT (gastric)  Nutritionally Relevant Medications: Scheduled Meds:  docusate  100 mg Per Tube BID   insulin aspart  0-9 Units Subcutaneous Q4H   pantoprazole IV  40 mg Intravenous QHS   polyethylene glycol  17 g Per Tube Daily   Continuous Infusions:  piperacillin-tazobactam (ZOSYN)  IV 12.5 mL/hr at 12/11/23 0800   propofol (DIPRIVAN) infusion 40 mcg/kg/min (12/11/23 0800)   Labs Reviewed: BUN 23 CBG ranges from 149-194 mg/dL over the last 24 hours  NUTRITION - FOCUSED PHYSICAL EXAM: Flowsheet Row Most Recent Value  Orbital Region No depletion  Upper Arm Region Mild depletion  Thoracic and Lumbar Region Moderate depletion  Buccal Region No depletion  Temple Region No depletion  Clavicle Bone Region Mild depletion  Clavicle and Acromion Bone Region Mild depletion  Scapular Bone Region No depletion  Dorsal Hand No depletion  Patellar Region Mild depletion  Anterior Thigh Region Mild depletion  Posterior Calf Region Mild depletion  Edema (RD Assessment) None  Hair Reviewed  Eyes Reviewed  Mouth Reviewed  Skin Reviewed  Nails Reviewed   Diet Order:   Diet Order  Diet NPO time specified  Diet effective now                   EDUCATION NEEDS:  Not appropriate for education at this time  Skin:  Skin Assessment: Reviewed RN Assessment  Last BM:  PTA  Height:  Ht Readings from Last 1 Encounters:  12/10/23 5\' 2"  (1.575 m)    Weight:  Wt Readings from Last 1 Encounters:  12/11/23 45 kg    Ideal Body Weight:  50 kg  BMI:  Body mass index is 18.15 kg/m.  Estimated Nutritional Needs:  Kcal:  1400-1600 kcal/d Protein:  75-90g/d Fluid:  >/=1.5L/d    Greig Castilla, RD,  LDN Registered Dietitian II Please reach out via secure chat Weekend on-call pager # available in [facility]

## 2023-12-11 NOTE — Consult Note (Signed)
2 Reason for Consult:pta Referring Physician: dr Jerral Ralph Deloise Brosius is an 57 y.o. female.  HPI: 2 day hx of sore throat. Started having difficulty with breathing and came to ER. The ER called me and said they needed intubation. I came to ER and the patient was already intubated. She was Ctscan  with huge left abscess. Transferred to [facility] ICU. No previous hx.  Past Medical History:  Diagnosis Date   Abdominal bloating    Cough    Dry throat    FUO (fever of unknown origin)    H/O one miscarriage    Headache    Multiple thyroid nodules    Thyroid cyst     Past Surgical History:  Procedure Laterality Date   DILATION AND CURETTAGE OF UTERUS     THYROIDECTOMY N/A 02/02/2016   Procedure: TOTAL THYROIDECTOMY;  Surgeon: Axel Filler, MD;  Location: [facility] ORS;  Service: General;  Laterality: N/A;    History reviewed. No pertinent family history.  Social History:  reports that she has never smoked. She has never used smokeless tobacco. She reports that she does not drink alcohol and does not use drugs.  Allergies: No Known Allergies  Medications: I have reviewed the patient's current medications.  Results for orders placed or performed during the hospital encounter of 12/10/23 (from the past 48 hours)  CBC with Differential     Status: Abnormal   Collection Time: 12/10/23  7:46 PM  Result Value Ref Range   WBC 19.3 (H) 4.0 - 10.5 K/uL   RBC 4.47 3.87 - 5.11 MIL/uL   Hemoglobin 13.4 12.0 - 15.0 g/dL   HCT 16.1 09.6 - 04.5 %   MCV 91.7 80.0 - 100.0 fL   MCH 30.0 26.0 - 34.0 pg   MCHC 32.7 30.0 - 36.0 g/dL   RDW 40.9 81.1 - 91.4 %   Platelets 254 150 - 400 K/uL   nRBC 0.0 0.0 - 0.2 %   Neutrophils Relative % 84 %   Neutro Abs 16.4 (H) 1.7 - 7.7 K/uL   Lymphocytes Relative 8 %   Lymphs Abs 1.5 0.7 - 4.0 K/uL   Monocytes Relative 7 %   Monocytes Absolute 1.3 (H) 0.1 - 1.0 K/uL   Eosinophils Relative 0 %   Eosinophils Absolute 0.0 0.0 - 0.5 K/uL   Basophils Relative 0 %    Basophils Absolute 0.0 0.0 - 0.1 K/uL   Immature Granulocytes 1 %   Abs Immature Granulocytes 0.10 (H) 0.00 - 0.07 K/uL    Comment: Performed at [facility], [address] [place], [place] [address]  Basic metabolic panel     Status: Abnormal   Collection Time: 12/10/23  7:46 PM  Result Value Ref Range   Sodium 141 135 - 145 mmol/L   Potassium 4.1 3.5 - 5.1 mmol/L   Chloride 101 98 - 111 mmol/L   CO2 26 22 - 32 mmol/L   Glucose, Bld 129 (H) 70 - 99 mg/dL    Comment: Glucose reference range applies only to samples taken after fasting for at least 8 hours.   BUN 23 (H) 6 - 20 mg/dL   Creatinine, Ser 6.21 0.44 - 1.00 mg/dL   Calcium 30.8 8.9 - 65.7 mg/dL   GFR, Estimated >84 >69 mL/min    Comment: (NOTE) Calculated using the CKD-EPI Creatinine Equation (2021)    Anion gap 14 5 - 15    Comment: Performed at [facility], [address] [place] [phone]  I-Stat arterial blood gas, ED     Status: Abnormal   Collection Time: 12/10/23 10:00 PM  Result Value Ref Range   pH, Arterial 7.430 7.35 - 7.45   pCO2 arterial 34.9 32 - 48 mmHg   pO2, Arterial 434 (H) 83 - 108 mmHg   Bicarbonate 23.2 20.0 - 28.0 mmol/L   TCO2 24 22 - 32 mmol/L   O2 Saturation 100 %   Acid-base deficit 1.0 0.0 - 2.0 mmol/L   Sodium 138 135 - 145 mmol/L   Potassium 3.7 3.5 - 5.1 mmol/L   Calcium, Ion 1.16 1.15 - 1.40 mmol/L   HCT 36.0 36.0 - 46.0 %   Hemoglobin 12.2 12.0 - 15.0 g/dL   Collection site RADIAL, ALLEN'S TEST ACCEPTABLE    Drawn by Operator    Sample type ARTERIAL   MRSA Next Gen by PCR, Nasal     Status: None   Collection Time: 12/11/23 12:53 AM   Specimen: Nasal Mucosa; Nasal Swab  Result Value Ref Range   MRSA by PCR Next Gen NOT DETECTED NOT DETECTED    Comment: (NOTE) The GeneXpert MRSA Assay (FDA approved for NASAL specimens only), is one component of a comprehensive MRSA colonization surveillance program. It is not intended to diagnose  MRSA infection nor to guide or monitor treatment for MRSA infections. Test performance is not FDA approved in patients less than 84 years old. Performed at [facility], [address] [place], [place] [address]   Glucose, capillary     Status: Abnormal   Collection Time: 12/11/23 12:57 AM  Result Value Ref Range   Glucose-Capillary 175 (H) 70 - 99 mg/dL    Comment: Glucose reference range applies only to samples taken after fasting for at least 8 hours.  Triglycerides     Status: None   Collection Time: 12/11/23  1:43 AM  Result Value Ref Range   Triglycerides 92 <150 mg/dL    Comment: Performed at [facility], [address], [place], [place] [address]  HIV Antibody (routine testing w rflx)     Status: None   Collection Time: 12/11/23  1:43 AM  Result Value Ref Range   HIV Screen 4th Generation wRfx Non Reactive Non Reactive    Comment: Performed at [facility], [address] [place], [place] [address]  Lactic acid, plasma     Status: None   Collection Time: 12/11/23  1:43 AM  Result Value Ref Range   Lactic Acid, Venous 0.9 0.5 - 1.9 mmol/L    Comment: Performed at [facility], [address] [place], [place] [address]  CBC     Status: Abnormal   Collection Time: 12/11/23  1:43 AM  Result Value Ref Range   WBC 19.8 (H) 4.0 - 10.5 K/uL   RBC 4.08 3.87 - 5.11 MIL/uL   Hemoglobin 12.4 12.0 - 15.0 g/dL   HCT 69.6 29.5 - 28.4 %   MCV 91.7 80.0 - 100.0 fL   MCH 30.4 26.0 - 34.0 pg   MCHC 33.2 30.0 - 36.0 g/dL   RDW 13.2 44.0 - 10.2 %   Platelets 223 150 - 400 K/uL   nRBC 0.0 0.0 - 0.2 %    Comment: Performed at [facility], [address] [place], [place] [address]  Basic metabolic panel     Status: Abnormal   Collection Time: 12/11/23  1:43 AM  Result Value Ref Range  Sodium 138 135 - 145 mmol/L   Potassium 3.8 3.5 - 5.1 mmol/L   Chloride 104 98 - 111 mmol/L   CO2 20 (L) 22 - 32 mmol/L   Glucose, Bld 179 (H) 70 - 99 mg/dL     Comment: Glucose reference range applies only to samples taken after fasting for at least 8 hours.   BUN 23 (H) 6 - 20 mg/dL   Creatinine, Ser 1.61 0.44 - 1.00 mg/dL   Calcium 9.2 8.9 - 09.6 mg/dL   GFR, Estimated >04 >54 mL/min    Comment: (NOTE) Calculated using the CKD-EPI Creatinine Equation (2021)    Anion gap 14 5 - 15    Comment: Performed at [facility], [address] [place], [place] [address]  Magnesium     Status: None   Collection Time: 12/11/23  1:43 AM  Result Value Ref Range   Magnesium 2.4 1.7 - 2.4 mg/dL    Comment: Performed at [facility], [address], [place], [place] [address]  Phosphorus     Status: None   Collection Time: 12/11/23  1:43 AM  Result Value Ref Range   Phosphorus 3.3 2.5 - 4.6 mg/dL    Comment: Performed at [facility], [address] [place], [place] [address]  Glucose, capillary     Status: Abnormal   Collection Time: 12/11/23  3:25 AM  Result Value Ref Range   Glucose-Capillary 194 (H) 70 - 99 mg/dL    Comment: Glucose reference range applies only to samples taken after fasting for at least 8 hours.    CT Soft Tissue Neck W Contrast Result Date: 12/10/2023 CLINICAL DATA:  Initial evaluation for peritonsillar abscess. EXAM: CT NECK WITH CONTRAST TECHNIQUE: Multidetector CT imaging of the neck was performed using the standard protocol following the bolus administration of intravenous contrast. RADIATION DOSE REDUCTION: This exam was performed according to the departmental dose-optimization program which includes automated exposure control, adjustment of the mA and/or kV according to patient size and/or use of iterative reconstruction technique. CONTRAST:  75mL OMNIPAQUE IOHEXOL 300 MG/ML  SOLN COMPARISON:  None Available. FINDINGS: Pharynx and larynx: Endotracheal and enteric tubes in place. The enteric tube courses superiorly into the posterior nasal passages/nasopharynx before coursing inferiorly into the esophagus. Oral  cavity itself within normal limits. Enlargement of the left palatine tonsil, consistent with acute tonsillitis. Superimposed heterogeneous rim enhancing collection measuring approximately 3.3 x 2.6 x 4.2 cm in greatest dimensions, consistent with tonsillar/peritonsillar abscess (series 2, image 42). Abscess extends superiorly towards the nasopharynx and soft palate. Associated swelling with hazy inflammatory stranding within the adjacent left parapharyngeal and submandibular spaces. No retropharyngeal collection. Epiglottis itself is effaced and not seen. Remainder of the hypopharynx and supraglottic larynx grossly within normal limits. Glottis grossly symmetric and normal. Subglottic airway clear. Tip of the ET tube terminates above the carina. Salivary glands: Salivary glands including the parotid and submandibular glands are within normal limits. Thyroid: Postoperative changes noted about the thyroid. Residual 1.5 cm right thyroid nodule (series 2, image 89). This has been previously evaluated by thyroid ultrasound and biopsy, most recently on 11/09/2015. Please refer to these examinations for any potential follow-up recommendations. Lymph nodes: Mild asymmetric prominence of left upper cervical lymph nodes, largest of which seen at level 2 and measures 1 cm in short axis, presumably reactive. Vascular: Normal intravascular enhancement seen within the neck. Proximal left IJ is partially compressed due to the inflammatory process within the left neck. Limited  intracranial: Unremarkable. Visualized orbits: Unremarkable. Mastoids and visualized paranasal sinuses: Scattered mucosal thickening present about the ethmoidal air cells and maxillary sinuses. Paranasal sinuses are otherwise clear. Mastoid air cells and middle ear cavities are well pneumatized and free of fluid. Skeleton: No discrete or worrisome osseous lesions. Upper chest: No other acute finding. Other: None. IMPRESSION: 1. Findings consistent with acute  left-sided tonsillitis with superimposed 3.3 x 2.6 x 4.2 cm tonsillar/peritonsillar abscess. 2. Mild asymmetric prominence of left upper cervical lymph nodes, presumably reactive. 3. Endotracheal and enteric tubes in place. The enteric tube courses superiorly into the posterior right nasal cavity before coursing inferiorly into the esophagus. 4. 1.5 cm right thyroid nodule (series 2, image 89). This has been previously evaluated by thyroid ultrasound and biopsy, most recently on 11/09/2015. Please refer to these examinations for any potential follow-up recommendations regarding this finding. Electronically Signed   By: Rise Mu M.D.   On: 12/10/2023 22:13   DG Chest Portable 1 View Result Date: 12/10/2023 CLINICAL DATA:  Intubation EXAM: PORTABLE CHEST 1 VIEW COMPARISON:  10/03/2016 FINDINGS: Endotracheal tube is 3.5 cm above the carina. NG tube is in the stomach. Heart and mediastinal contours are within normal limits. No focal opacities or effusions. No acute bony abnormality. IMPRESSION: Support devices in expected position. No active cardiopulmonary disease. Electronically Signed   By: Charlett Nose M.D.   On: 12/10/2023 21:21    ROS Blood pressure 103/75, pulse 97, temperature 99.2 F (37.3 C), temperature source Oral, resp. rate 16, height 5\' 2"  (1.575 m), weight 45 kg, SpO2 99%. Physical Exam Constitutional:      Comments: intubated  HENT:     Right Ear: External ear normal.     Left Ear: External ear normal.     Nose: Nose normal.     Mouth/Throat:     Comments: Significant swelling of the soft palate and left side swelling to past midline she is intubated so exam difficult Eyes:     Pupils: Pupils are equal, round, and reactive to light.  Musculoskeletal:     Cervical back: Normal range of motion.       Assessment/Plan: Left pta- Ct scan show a very large PTA that need I/D / will schedule with the OR I discussed the situation with her husband and I/D. Discussed  risks,benefits and options. All questions answered and consent obtained verbally with ICU nurse witness. This was through [employer] 12/11/2023, 7:33 AM

## 2023-12-11 NOTE — ED Notes (Signed)
[facility] picked up patient to transfer her to [facility].

## 2023-12-11 NOTE — Op Note (Signed)
Preop/postop diagnosis: Left peritonsillar abscess Procedure: Incision and drainage left peritonsillar abscess Anesthesia: General Estimated blood loss approximately 5 cc Indications: 57 year old with a 2 or 3-day history of sore throat.  She came in last night with airway problems and had to be intubated in the emergency room.  She had a CT scan that shows an excessively large left abscess.  She apparently has not had problems with this previously.  The husband was informed risk and benefits of the procedure and options were discussed all questions were answered and consent was obtained. Procedure: Patient was taken the op room placed supine position and after already orally intubated the Crowe date if she was draped in the usual sterile manner.  The Crowe Earlene Plater was inserted retracted and suspended from the [facility] stand.  The abscess had already spontaneously drained and there was significant foul-smelling purulence in the pharynx that was suctioned out.  There was an obvious perforation site on the superior medial pole of the tonsil.  This was cultured the pus that was coming out.  The incision was made in the peritonsillar region with electrocautery and dissection bluntly with a tonsil hemostat.  It opened up into a large cavity that was with further purulence that was suctioned out.  The wound was irrigated with saline.  There was no bleeding.  The patient was without significant swelling of the soft palate or uvula.  It did appear that the patient likely could be extubated because of the degree of swelling it was elected to leave her intubated.  She was transferred to the intensive care unit in stable condition

## 2023-12-11 NOTE — Anesthesia Preprocedure Evaluation (Addendum)
Anesthesia Evaluation  Patient identified by MRN, date of birth, ID band Patient unresponsive    Reviewed: Allergy & Precautions, NPO status , Patient's Chart, lab work & pertinent test results  Airway Mallampati: Intubated  TM Distance: >3 FB Neck ROM: Full   Comment: Int Dental no notable dental hx.    Pulmonary    Pulmonary exam normal breath sounds clear to auscultation       Cardiovascular Normal cardiovascular exam Rhythm:Regular Rate:Normal     Neuro/Psych    GI/Hepatic negative GI ROS, Neg liver ROS,,,  Endo/Other  Hypothyroidism    Renal/GU negative Renal ROSLab Results      Component                Value               Date                      NA                       138                 12/11/2023                CL                       104                 12/11/2023                K                        3.8                 12/11/2023                CO2                      20 (L)              12/11/2023                BUN                      23 (H)              12/11/2023                CREATININE               0.72                12/11/2023                GFRNONAA                 >60                 12/11/2023                CALCIUM                  9.2                 12/11/2023                PHOS  3.3                 12/11/2023                GLUCOSE                  179 (H)             12/11/2023                Musculoskeletal   Abdominal   Peds  Hematology Lab Results      Component                Value               Date                      WBC                      19.8 (H)            12/11/2023                HGB                      12.4                12/11/2023                HCT                      37.4                12/11/2023                MCV                      91.7                12/11/2023                PLT                      223                  12/11/2023              Anesthesia Other Findings Acute left-sided tonsillitis with peritonsillar abscess Acute respiratory insufficiency Hypothyroidism   Reproductive/Obstetrics                             Anesthesia Physical Anesthesia Plan  ASA: 2 and emergent  Anesthesia Plan: General   Post-op Pain Management:    Induction: Inhalational  PONV Risk Score and Plan: Treatment may vary due to age or medical condition and Midazolam  Airway Management Planned:   Additional Equipment: None  Intra-op Plan:   Post-operative Plan: Post-operative intubation/ventilation  Informed Consent: I have reviewed the patients History and Physical, chart, labs and discussed the procedure including the risks, benefits and alternatives for the proposed anesthesia with the patient or authorized representative who has indicated his/her understanding and acceptance.     Only emergency history available  Plan Discussed with: CRNA  Anesthesia Plan Comments:         Anesthesia Quick Evaluation

## 2023-12-11 NOTE — ED Notes (Signed)
Report called to RT at [facility]. 

## 2023-12-11 NOTE — Anesthesia Postprocedure Evaluation (Signed)
Anesthesia Post Note  Patient: Tanya Armstrong  Procedure(s) Performed: Irrigation and Debridement of Peritonsillar Abscess     Patient location during evaluation: SICU Anesthesia Type: General Level of consciousness: sedated Pain management: pain level controlled Vital Signs Assessment: post-procedure vital signs reviewed and stable Respiratory status: patient remains intubated per anesthesia plan Cardiovascular status: stable Postop Assessment: no apparent nausea or vomiting Anesthetic complications: no   No notable events documented.  Last Vitals:  Vitals:   12/11/23 1115 12/11/23 1130  BP:    Pulse: 82   Resp: 16   Temp:  37.3 C  SpO2: 99%     Last Pain:  Vitals:   12/11/23 1130  TempSrc: Oral  PainSc:                  Trevor Iha

## 2023-12-11 NOTE — Progress Notes (Addendum)
NAME:  Tanya Armstrong, MRN:  [id number], DOB:  September 22, 1966, LOS: 0 ADMISSION DATE:  12/10/2023, CONSULTATION DATE:  12/12 REFERRING MD:  EDP CHIEF COMPLAINT:  Peritonsillar Abscess   History of Present Illness:  57 year old woman who presented to [facility] 12/11 for fever and peritonsillar abscess. PMHx significant for hypothyroidism.   Patient is intubated, therefore history is obtained primarily from chart review. Patient reportedly presented to Urgent Care 12/11 for a 2-day history of sore throat, pain with swallowing and inability to eat. Left-sided jaw swelling was noted as well as hoarseness. +Low grade fevers noted at home. Peritonsillar abscess was suspected and she was advised to proceed to ED for further care. At the time of presentation to [facility] ED, patient was having significant voice changes, gurgling respirations and difficulty talking with development of stridor. Ultimately, decision was made to intubate patient for airway protection.   Labs were notable for WBC 19.3, Hgb/Plt WNL. BMP was grossly unremarkable. Blood cultures were obtained and pending; broad-spectrum Zosyn was given as well as Decadron for swelling. Post-intubation ABG was WNL.  CT Neck was completed at [facility] demonstrating acute L-sided tonsillitis with superimposed tonsillar/peritonsillar abscess (3.3 x 2.6 x 4.2cm) with asymmetric L upper cervical LN. ENT (Dr. Jearld Fenton) was consulted with plan for OR 12/12 for management of abscess   PCCM consulted for ICU admission to [facility]. Pertinent  Medical History  Hypothyroidism Thyroid Nodules  Significant Hospital Events: Including procedures, antibiotic start and stop dates in addition to other pertinent events   12/11: Presented to [facility] for fever and peritonsillar abscess from urgent care. Intubated for airway protection. ENT consulted.  12/12: Transferred [facility] ICU.  Interim History / Subjective:  Sedated, not following commands  Objective   Blood pressure  115/77, pulse 95, temperature 99.2 F (37.3 C), temperature source Oral, resp. rate 16, height 5\' 2"  (1.575 m), weight 45 kg, SpO2 99%.    Vent Mode: PRVC FiO2 (%):  [40 %-100 %] 40 % Set Rate:  [16 bmp] 16 bmp Vt Set:  [400 mL] 400 mL PEEP:  [5 cmH20] 5 cmH20 Plateau Pressure:  [15 cmH20] 15 cmH20   Intake/Output Summary (Last 24 hours) at 12/11/2023 0756 Last data filed at 12/11/2023 0656 Gross per 24 hour  Intake 299.98 ml  Output 375 ml  Net -75.02 ml   Filed Weights   12/10/23 1936 12/11/23 0500  Weight: 46.7 kg 45 kg    Examination: General: NAD, sedated and intubated HENT: intubated, pinpoint pupils Lungs: CTAB Cardiovascular: sinus tachycardia, good pulses Abdomen: soft abdomen, bowel sounds present Extremities: no asymmetry Neuro: open eyes to sternal rub but not voice GU: foley present  Labs   CBC: Recent Labs  Lab 12/10/23 1946 12/10/23 2200 12/11/23 0143  WBC 19.3*  --  19.8*  NEUTROABS 16.4*  --   --   HGB 13.4 12.2 12.4  HCT 41.0 36.0 37.4  MCV 91.7  --  91.7  PLT 254  --  223   Basic Metabolic Panel: Recent Labs  Lab 12/10/23 1946 12/10/23 2200 12/11/23 0143  NA 141 138 138  K 4.1 3.7 3.8  CL 101  --  104  CO2 26  --  20*  GLUCOSE 129*  --  179*  BUN 23*  --  23*  CREATININE 0.59  --  0.72  CALCIUM 10.0  --  9.2  MG  --   --  2.4  PHOS  --   --  3.3   GFR:  Estimated Creatinine Clearance: 54.5 mL/min (by C-G formula based on SCr of 0.72 mg/dL). Recent Labs  Lab 12/10/23 1946 12/11/23 0143  WBC 19.3* 19.8*  LATICACIDVEN  --  0.9   Liver Function Tests: No results for input(s): "AST", "ALT", "ALKPHOS", "BILITOT", "PROT", "ALBUMIN" in the last 168 hours. No results for input(s): "LIPASE", "AMYLASE" in the last 168 hours. No results for input(s): "AMMONIA" in the last 168 hours. ABG    Component Value Date/Time   PHART 7.430 12/10/2023 2200   PCO2ART 34.9 12/10/2023 2200   PO2ART 434 (H) 12/10/2023 2200   HCO3 23.2 12/10/2023  2200   TCO2 24 12/10/2023 2200   ACIDBASEDEF 1.0 12/10/2023 2200   O2SAT 100 12/10/2023 2200    Coagulation Profile: No results for input(s): "INR", "PROTIME" in the last 168 hours. Cardiac Enzymes: No results for input(s): "CKTOTAL", "CKMB", "CKMBINDEX", "TROPONINI" in the last 168 hours. HbA1C: No results found for: "HGBA1C" CBG: Recent Labs  Lab 12/11/23 0057 12/11/23 0325 12/11/23 0729  GLUCAP 175* 194* 149*   Consults  ENT Assessment & Plan:  Principal Problem:   Peritonsillar abscess Active Problems:   Hypothyroidism  Peritonsillar Abscess Airway compromise 2/2 to problem 1  CT showed acute left sided tonsillitis with superimposed tonsillar/peritonsillar abscess. Plan for I&D this am. On zosyn. Will switch to Unasyn. Blood culture pending. Will get culture of abscess after I&D. Pt intubated for airway protection on minimal setting currently. Continue vent support with vent bundle. RASS goal -1. Will discuss with ENT if pt can be extubated post surgical procedure as reasoning for intubation was airway protection. Will confirm final duration of abx with ENT and need for steroids to reduce airway inflammation/edema.   Hypothyroidism Chronic. Continue levothyroxine 75 mcg daily. Has thyroid nodule will need OP follow up. Was previously biopsied but sample was insufficient.   Best Practice (right click and "Reselect all SmartList Selections" daily)  Diet/type: NPO DVT prophylaxis: prophylactic heparin  GI prophylaxis: PPI Lines: N/A Foley:  Yes, and it is still needed Continuous: Fentanyl 200, Prop 40  Code Status:  full code Last date of multidisciplinary goals of care discussion [Plan for 12/12]

## 2023-12-11 NOTE — Progress Notes (Signed)
Patient back from the OR at this time. Placed back on the ventilator at previous settings.

## 2023-12-11 NOTE — Procedures (Signed)
Extubation Procedure Note  Patient Details:   Name: Tanya Armstrong DOB: [birth year] MRN: [id number]   Airway Documentation:    Vent end date: 12/11/23 Vent end time: 1700   Evaluation  O2 sats: stable throughout Complications: No apparent complications Patient did tolerate procedure well. Bilateral Breath Sounds: Diminished   Yes  Pt extubated to 2l [place], pt tolerated well. Cuff leak present, no stridor noted, CCM aware, RN at bedside, RT will monitor as need.   Thornell Mule 12/11/2023, 5:08 PM

## 2023-12-11 NOTE — Transfer of Care (Signed)
Immediate Anesthesia Transfer of Care Note  Patient: Tanya Armstrong  Procedure(s) Performed: Irrigation and Debridement of Peritonsillar Abscess  Patient Location: ICU  Anesthesia Type:General  Level of Consciousness: sedated  Airway & Oxygen Therapy: Patient remains intubated per anesthesia plan  Post-op Assessment: Report given to RN and Post -op Vital signs reviewed and stable  Post vital signs: Reviewed and stable  Last Vitals:  Vitals Value Taken Time  BP 115/86 12/11/23 1003  Temp    Pulse 75 12/11/23 1005  Resp 16 12/11/23 1005  SpO2 99 % 12/11/23 1005  Vitals shown include unfiled device data.  Last Pain:  Vitals:   12/11/23 0732  TempSrc: Oral  PainSc:          Complications: No notable events documented.

## 2023-12-11 NOTE — Progress Notes (Signed)
eLink Physician-Brief Progress Note Patient Name: Tanya Armstrong DOB: 09/23/1966 MRN: [id number]   Date of Service  12/11/2023  HPI/Events of Note  Patient admitted with acute respiratory failure secondary to a peri-tonsillar abscess with acute airway  compromise, she was intubated in the emergency department and is admitted to the ICU for monitoring pending incision and drainage of the abscess scheduled for tomorrow morning.  eICU Interventions  New Patient Evaluation.        Thomasene Lot Gerlad Pelzel 12/11/2023, 2:16 AM

## 2023-12-12 ENCOUNTER — Other Ambulatory Visit (HOSPITAL_COMMUNITY): Payer: Self-pay

## 2023-12-12 ENCOUNTER — Encounter (HOSPITAL_COMMUNITY): Payer: Self-pay | Admitting: Otolaryngology

## 2023-12-12 ENCOUNTER — Inpatient Hospital Stay (HOSPITAL_COMMUNITY): Payer: BC Managed Care – PPO

## 2023-12-12 DIAGNOSIS — R9431 Abnormal electrocardiogram [ECG] [EKG]: Secondary | ICD-10-CM | POA: Diagnosis not present

## 2023-12-12 DIAGNOSIS — J36 Peritonsillar abscess: Secondary | ICD-10-CM | POA: Diagnosis not present

## 2023-12-12 LAB — ECHOCARDIOGRAM COMPLETE
AR max vel: 2.08 cm2
AV Area VTI: 2.11 cm2
AV Area mean vel: 1.85 cm2
AV Mean grad: 2 mm[Hg]
AV Peak grad: 4.2 mm[Hg]
Ao pk vel: 1.03 m/s
Area-P 1/2: 4.36 cm2
Calc EF: 62.3 %
Height: 62 in
MV VTI: 2.3 cm2
S' Lateral: 2.8 cm
Single Plane A2C EF: 54.5 %
Single Plane A4C EF: 73.1 %
Weight: 1583.78 [oz_av]

## 2023-12-12 LAB — CBC WITH DIFFERENTIAL/PLATELET
Abs Immature Granulocytes: 0.06 10*3/uL (ref 0.00–0.07)
Basophils Absolute: 0 10*3/uL (ref 0.0–0.1)
Basophils Relative: 0 %
Eosinophils Absolute: 0 10*3/uL (ref 0.0–0.5)
Eosinophils Relative: 0 %
HCT: 33.5 % — ABNORMAL LOW (ref 36.0–46.0)
Hemoglobin: 11.3 g/dL — ABNORMAL LOW (ref 12.0–15.0)
Immature Granulocytes: 0 %
Lymphocytes Relative: 8 %
Lymphs Abs: 1.2 10*3/uL (ref 0.7–4.0)
MCH: 30.7 pg (ref 26.0–34.0)
MCHC: 33.7 g/dL (ref 30.0–36.0)
MCV: 91 fL (ref 80.0–100.0)
Monocytes Absolute: 0.7 10*3/uL (ref 0.1–1.0)
Monocytes Relative: 5 %
Neutro Abs: 12.3 10*3/uL — ABNORMAL HIGH (ref 1.7–7.7)
Neutrophils Relative %: 87 %
Platelets: 244 10*3/uL (ref 150–400)
RBC: 3.68 MIL/uL — ABNORMAL LOW (ref 3.87–5.11)
RDW: 12.4 % (ref 11.5–15.5)
WBC: 14.2 10*3/uL — ABNORMAL HIGH (ref 4.0–10.5)
nRBC: 0 % (ref 0.0–0.2)

## 2023-12-12 LAB — GLUCOSE, CAPILLARY
Glucose-Capillary: 169 mg/dL — ABNORMAL HIGH (ref 70–99)
Glucose-Capillary: 153 mg/dL — ABNORMAL HIGH (ref 70–99)
Glucose-Capillary: 159 mg/dL — ABNORMAL HIGH (ref 70–99)

## 2023-12-12 LAB — BASIC METABOLIC PANEL
Anion gap: 11 (ref 5–15)
BUN: 26 mg/dL — ABNORMAL HIGH (ref 6–20)
CO2: 24 mmol/L (ref 22–32)
Calcium: 8.5 mg/dL — ABNORMAL LOW (ref 8.9–10.3)
Chloride: 107 mmol/L (ref 98–111)
Creatinine, Ser: 0.53 mg/dL (ref 0.44–1.00)
GFR, Estimated: >60 mL/min (ref >60)
Glucose, Bld: 178 mg/dL — ABNORMAL HIGH (ref 70–99)
Potassium: 3.6 mmol/L (ref 3.5–5.1)
Sodium: 142 mmol/L (ref 135–145)

## 2023-12-12 MED ORDER — AMOXICILLIN-POT CLAVULANATE 875-125 MG PO TABS
1.0000 | ORAL_TABLET | Freq: Two times a day (BID) | ORAL | Status: DC
  Administered 2023-12-12: 1 via ORAL
  Filled 2023-12-12 (×2): qty 1

## 2023-12-12 MED ORDER — POLYETHYLENE GLYCOL 3350 17 GM/SCOOP PO POWD
17.0000 g | Freq: Every day | ORAL | 0 refills | Status: AC
  Filled 2023-12-12: qty 238, 14d supply, fill #0

## 2023-12-12 MED ORDER — DOCUSATE SODIUM 50 MG/5ML PO LIQD
100.0000 mg | Freq: Two times a day (BID) | ORAL | Status: DC
Start: 2023-12-12 — End: 2023-12-12

## 2023-12-12 MED ORDER — POTASSIUM CHLORIDE 10 MEQ/100ML IV SOLN
10.0000 meq | INTRAVENOUS | Status: AC
  Administered 2023-12-12 (×4): 10 meq via INTRAVENOUS
  Filled 2023-12-12 (×2): qty 100

## 2023-12-12 MED ORDER — AMOXICILLIN-POT CLAVULANATE 875-125 MG PO TABS
1.0000 | ORAL_TABLET | Freq: Two times a day (BID) | ORAL | 0 refills | Status: AC
  Filled 2023-12-12: qty 17, 9d supply, fill #0

## 2023-12-12 MED ORDER — POLYETHYLENE GLYCOL 3350 17 G PO PACK: 17.0000 g | PACK | Freq: Every day | ORAL | Status: DC

## 2023-12-12 MED ORDER — LEVOTHYROXINE SODIUM 75 MCG PO TABS
75.0000 ug | ORAL_TABLET | Freq: Every day | ORAL | 0 refills | Status: AC
  Filled 2023-12-12: qty 30, 30d supply, fill #0

## 2023-12-12 MED ORDER — LEVOTHYROXINE SODIUM 25 MCG PO TABS: 75.0000 ug | ORAL_TABLET | Freq: Every day | ORAL | Status: DC

## 2023-12-12 MED ORDER — DOCUSATE SODIUM 50 MG/5ML PO LIQD
100.0000 mg | Freq: Two times a day (BID) | ORAL | 0 refills | Status: AC
  Filled 2023-12-12: qty 100, 5d supply, fill #0

## 2023-12-12 NOTE — Progress Notes (Signed)
NAME:  Tanya Armstrong, MRN:  [id number], DOB:  05/02/1966, LOS: 1 ADMISSION DATE:  12/10/2023, CONSULTATION DATE:  12/12 REFERRING MD:  EDP CHIEF COMPLAINT:  Peritonsillar Abscess   History of Present Illness:  57 year old woman who presented to [facility] 12/11 for fever and peritonsillar abscess. PMHx significant for hypothyroidism.   Patient is intubated, therefore history is obtained primarily from chart review. Patient reportedly presented to Urgent Care 12/11 for a 2-day history of sore throat, pain with swallowing and inability to eat. Left-sided jaw swelling was noted as well as hoarseness. +Low grade fevers noted at home. Peritonsillar abscess was suspected and she was advised to proceed to ED for further care. At the time of presentation to [facility] ED, patient was having significant voice changes, gurgling respirations and difficulty talking with development of stridor. Ultimately, decision was made to intubate patient for airway protection.   Labs were notable for WBC 19.3, Hgb/Plt WNL. BMP was grossly unremarkable. Blood cultures were obtained and pending; broad-spectrum Zosyn was given as well as Decadron for swelling. Post-intubation ABG was WNL.  CT Neck was completed at [facility] demonstrating acute L-sided tonsillitis with superimposed tonsillar/peritonsillar abscess (3.3 x 2.6 x 4.2cm) with asymmetric L upper cervical LN. ENT (Dr. Jearld Fenton) was consulted with plan for OR 12/12 for management of abscess   PCCM consulted for ICU admission to [facility]. Pertinent  Medical History  Hypothyroidism Thyroid Nodules  Significant Hospital Events: Including procedures, antibiotic start and stop dates in addition to other pertinent events   12/11: Presented to [facility] for fever and peritonsillar abscess from urgent care. Intubated for airway protection. ENT consulted.  12/12: Transferred [facility] ICU.  Interim History / Subjective:  No complaints, has not eaten yet but feels like she could. No  trouble breathing. Comfortable to d/c home today   Objective   Blood pressure 120/84, pulse 85, temperature 98.2 F (36.8 C), temperature source Oral, resp. rate 18, height 5\' 2"  (1.575 m), weight 44.9 kg, SpO2 100%.    Vent Mode: PSV;CPAP FiO2 (%):  [40 %] 40 % Set Rate:  [16 bmp] 16 bmp Vt Set:  [400 mL] 400 mL PEEP:  [5 cmH20] 5 cmH20 Pressure Support:  [8 cmH20] 8 cmH20 Plateau Pressure:  [15 cmH20] 15 cmH20   Intake/Output Summary (Last 24 hours) at 12/12/2023 0758 Last data filed at 12/12/2023 0600 Gross per 24 hour  Intake 1516.84 ml  Output 675 ml  Net 841.84 ml   Filed Weights   12/10/23 1936 12/11/23 0500 12/12/23 0413  Weight: 46.7 kg 45 kg 44.9 kg    Examination: General: sitting up in bed, alert  HENT: mild edema of L oropharnyx. No trismus Lungs: CTAB, normal WOB Cardiovascular: RRR Abdomen: soft, non tender Neuro: alert, no focal deficits    Labs   CBC: Recent Labs  Lab 12/10/23 1946 12/10/23 2200 12/11/23 0143 12/12/23 0226  WBC 19.3*  --  19.8* 14.2*  NEUTROABS 16.4*  --   --  12.3*  HGB 13.4 12.2 12.4 11.3*  HCT 41.0 36.0 37.4 33.5*  MCV 91.7  --  91.7 91.0  PLT 254  --  223 244   Basic Metabolic Panel: Recent Labs  Lab 12/10/23 1946 12/10/23 2200 12/11/23 0143 12/12/23 0226  NA 141 138 138 142  K 4.1 3.7 3.8 3.6  CL 101  --  104 107  CO2 26  --  20* 24  GLUCOSE 129*  --  179* 178*  BUN 23*  --  23* 26*  CREATININE 0.59  --  0.72 0.53  CALCIUM 10.0  --  9.2 8.5*  MG  --   --  2.4  --   PHOS  --   --  3.3  --    GFR: Estimated Creatinine Clearance: 54.3 mL/min (by C-G formula based on SCr of 0.53 mg/dL). Recent Labs  Lab 12/10/23 1946 12/11/23 0143 12/12/23 0226  WBC 19.3* 19.8* 14.2*  LATICACIDVEN  --  0.9  --    Liver Function Tests: No results for input(s): "AST", "ALT", "ALKPHOS", "BILITOT", "PROT", "ALBUMIN" in the last 168 hours. No results for input(s): "LIPASE", "AMYLASE" in the last 168 hours. No results for  input(s): "AMMONIA" in the last 168 hours. ABG    Component Value Date/Time   PHART 7.430 12/10/2023 2200   PCO2ART 34.9 12/10/2023 2200   PO2ART 434 (H) 12/10/2023 2200   HCO3 23.2 12/10/2023 2200   TCO2 24 12/10/2023 2200   ACIDBASEDEF 1.0 12/10/2023 2200   O2SAT 100 12/10/2023 2200    Coagulation Profile: No results for input(s): "INR", "PROTIME" in the last 168 hours. Cardiac Enzymes: No results for input(s): "CKTOTAL", "CKMB", "CKMBINDEX", "TROPONINI" in the last 168 hours. HbA1C: No results found for: "HGBA1C" CBG: Recent Labs  Lab 12/11/23 1555 12/11/23 1912 12/11/23 2306 12/12/23 0308 12/12/23 0739  GLUCAP 196* 162* 175* 169* 153*   Consults  ENT Assessment & Plan:  Principal Problem:   Peritonsillar abscess Active Problems:   Hypothyroidism   Airway compromise  Peritonsillar Abscess Airway compromise 2/2 to problem 1  CT showed acute left sided tonsillitis with superimposed tonsillar/peritonsillar abscess. S/p I&D and extubated12/12.  - f/u cx - transition from Unasyn to Augmentin  - Edema as expected per ENT. No need for steroids - if able to eat, can d/c today  Hypothyroidism Chronic. Continue levothyroxine 75 mcg daily. Has thyroid nodule will need OP follow up. Was previously biopsied but sample was insufficient.   Best Practice (right click and "Reselect all SmartList Selections" daily)  Diet/type: Soft DVT prophylaxis: prophylactic heparin  GI prophylaxis: PPI Lines: N/A Foley: yes - needs removal  Continuous: Fentanyl 200, Prop 40  Code Status:  full code   Erick Alley Family Medicine PGY-3

## 2023-12-12 NOTE — Progress Notes (Signed)
SLP Cancellation Note  Patient Details Name: Tanya Armstrong MRN: [id number] DOB: Jul 28, 1966   Cancelled treatment:       Reason Eval/Treat Not Completed: Other (comment).  Discussed with Dr. Jearld Fenton who advised that swallow eval not indicated at this time. Only intubated briefly, swallowing water now without difficulty and may likely discharge today.   Garl Speigner MA, CCC-SLP    [name] 12/12/2023, 8:20 AM

## 2023-12-12 NOTE — Progress Notes (Signed)
[facility] ADULT ICU REPLACEMENT PROTOCOL   The patient does apply for the [facility] Adult ICU Electrolyte Replacment Protocol based on the criteria listed below:   1.Exclusion criteria: TCTS, ECMO, Dialysis, and Myasthenia Gravis patients 2. Is GFR >/= 30 ml/min? Yes.    Patient's GFR today is >60 3. Is SCr </= 2? Yes.   Patient's SCr is 0.53 mg/dL 4. Did SCr increase >/= 0.5 in 24 hours? No. 5.Pt's weight >40kg  Yes.   6. Abnormal electrolyte(s):  K 3.6  7. Electrolytes replaced per protocol 8.  Call MD STAT for K+ </= 2.5, Phos </= 1, or Mag </= 1 Physician:  Shawn Stall R Malaia Buchta 12/12/2023 5:09 AM

## 2023-12-12 NOTE — Evaluation (Signed)
Physical Therapy Evaluation and Discharge Patient Details Name: Tanya Armstrong MRN: [id number] DOB: Feb 08, 1966 Today's Date: 12/12/2023  History of Present Illness  57 year old woman who presented 12/11 for fever and peritonsillar abscess. S/p Incision and drainage left peritonsillar abscess 12/12. PMHx significant for hypothyroidism.   Clinical Impression  Patient evaluated by Physical Therapy with no further acute PT needs identified. All education has been completed and the patient has no further questions. 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 Patient able to mobilize safely at a supervision to Mod I level today. Ambulating 150 feet around unit without assistive device. Husband present and very supportive, can provide full supervision/assist as needed at home following d/c. They have no further questions or concerns regarding mobility. VSS while mobilizing with HR in 90s, SpO2 97% on RA. See below for any follow-up Physical Therapy or equipment needs. PT is signing off. Thank you for this referral.     Pt had a spontaneous period (approx 1 min) of elevated HR into 130s while resting in bed prior to mobilizing. HR was 80s prior to onset, and returned to baseline spontaneously after run. RN notified. Pt denied chest pain or palpitations. Rhythm appeared regular, no tele alerts.    If plan is discharge home, recommend the following: A little help with walking and/or transfers;Assistance with cooking/housework   Can travel by private vehicle        Equipment Recommendations None recommended by PT  Recommendations for Other Services       Functional Status Assessment Patient has had a recent decline in their functional status and demonstrates the ability to make significant improvements in function in a reasonable and predictable amount of time.     Precautions / Restrictions Precautions Precautions: None Restrictions Weight Bearing Restrictions Per Provider Order:  No      Mobility  Bed Mobility Overal bed mobility: Modified Independent             General bed mobility comments: extra time, assisted with lines only    Transfers Overall transfer level: Needs assistance Equipment used: None Transfers: Sit to/from Stand Sit to Stand: Supervision           General transfer comment: supervision for safety, slow and guarded, no assist needed. Mild sway but able to self correct. Performed from bed x2. Second time, after ambulatory bout Mod I level.    Ambulation/Gait Ambulation/Gait assistance: Contact guard assist Gait Distance (Feet): 150 Feet Assistive device: 1 person hand held assist, None Gait Pattern/deviations: WFL(Within Functional Limits) Gait velocity: dec Gait velocity interpretation: <1.31 ft/sec, indicative of household ambulator   General Gait Details: Slower pace but without overt LOB. Initially holding husbands hand but progressed without support towards end of distance. VSS throughout on RA. 96%+ SpO2. HR in 90s. Educated on safety, and awareness.  Stairs            Wheelchair Mobility     Tilt Bed    Modified Rankin (Stroke Patients Only)       Balance Overall balance assessment: Mild deficits observed, not formally tested                                           Pertinent Vitals/Pain Pain Assessment Pain Assessment: No/denies pain    Home Living Family/patient expects to be discharged to:: Private residence Living Arrangements: Spouse/significant other Available Help at Discharge:  Family;Available 24 hours/day Type of Home: House Home Access: Level entry       Home Layout: One level Home Equipment: None      Prior Function Prior Level of Function : Independent/Modified Independent;Driving;Working/employed             Mobility Comments: ind no issues 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  Pt had a 1 min run with HR at 130 bpm prior to getting OOB then spontaneously resolved back to 80s. Post activity BP 132/81 in recliner SpO2 97% on RA.    Exercises     Assessment/Plan    PT Assessment Patient does not need any further PT services  PT Problem List         PT Treatment Interventions      PT Goals (Current goals can be found in the Care Plan section)  Acute Rehab PT Goals Patient Stated Goal: Go home PT Goal Formulation: All assessment and education complete, DC therapy    Frequency       Co-evaluation               AM-PAC PT "6 Clicks" Mobility  Outcome Measure Help needed turning from your back to your side while in a flat bed without using bedrails?: None Help needed moving from lying on your back to sitting on the side of a flat bed without using bedrails?: None Help needed moving to and from a bed to a chair (including a wheelchair)?: A Little Help needed standing up from a chair using your arms (e.g., wheelchair or bedside chair)?: A Little Help needed to walk in hospital room?: A Little Help needed climbing 3-5 steps with a railing? : A Little 6 Click Score: 20    End of Session   Activity Tolerance: Patient tolerated treatment well Patient left: in chair;with call bell/phone within reach;with chair alarm set;with family/visitor present Nurse Communication: Mobility status PT  Visit Diagnosis: Other abnormalities of gait and mobility (R26.89);Unsteadiness on feet (R26.81)    Time: 4010-2725 PT Time Calculation (min) (ACUTE ONLY): 30 min   Charges:   PT Evaluation $PT Eval Low Complexity: 1 Low PT Treatments $Gait Training: 8-22 mins PT General Charges $$ ACUTE PT VISIT: 1 Visit         Kathlyn Sacramento, PT, DPT [facility]  Rehabilitation Services Physical Therapist Office: [phone] Website: [email or web address]   Berton Mount 12/12/2023, 10:28 AM

## 2023-12-12 NOTE — Progress Notes (Signed)
  Echocardiogram 2D Echocardiogram has been performed.  Ocie Doyne RDCS 12/12/2023, 10:47 AM

## 2023-12-12 NOTE — Discharge Summary (Signed)
Physician Discharge Summary         Patient ID: Tanya Armstrong MRN: [id number] DOB/AGE: [birth year] 57 y.o.  Admit date: 12/10/2023 Discharge date: 12/12/2023  Discharge Diagnoses:    Active Hospital Problems   Diagnosis Date Noted   Peritonsillar abscess 12/10/2023   Hypothyroidism 12/11/2023   Airway compromise 12/11/2023    Resolved Hospital Problems  No resolved problems to display.      Discharge summary    57 year old woman presented to [facility] 12/11 for fever and peritonsillar abscess. Vitals stable. Labs significant for leukocytosis. Blood dc were drawn. She was s/p tx with zosyn and decadron in ED and CT neck had confirmed PTA. D/t concern for airway obstruction, she was intubated.  At [facility], ENT performed PTA I&D on 12/11/23 with no complications and pt was able to be extubated. Abx narrowed to Unasyn and she continue tx with steroids. By discharge she was able to eat a soft diet and transitions to Augmentin.   Discharge Plan by Active Problems    Peritonsillar abscess s/p I&D Expected edema, not compromising airway, no need to continue steroids at discharge Complete 10 day course Augmentin  F/u with ENT in 2 weeks, apt made for 12/26/23  Significant Hospital tests/ studies  CT neck - L sided tonsillitis with PTA  Procedures   PTA I&D 12/11/23  Culture data/antimicrobials   Aerobic cx pending at discharge    Consults   ENT    Discharge Exam: BP 120/84   Pulse 85   Temp 98.2 F (36.8 C) (Oral)   Resp 18   Ht 5\' 2"  (1.575 m)   Wt 44.9 kg   LMP  (LMP Unknown)   SpO2 100%   BMI 18.10 kg/m   General: sitting up in bed, alert  HENT: mild edema of L oropharnyx. No trismus Lungs: CTAB, normal WOB Cardiovascular: RRR Abdomen: soft, non tender Neuro: alert, no focal deficits   Labs at discharge   Lab Results  Component Value Date   CREATININE 0.53 12/12/2023   BUN 26 (H) 12/12/2023   NA 142 12/12/2023   K 3.6 12/12/2023   CL 107  12/12/2023   CO2 24 12/12/2023   Lab Results  Component Value Date   WBC 14.2 (H) 12/12/2023   HGB 11.3 (L) 12/12/2023   HCT 33.5 (L) 12/12/2023   MCV 91.0 12/12/2023   PLT 244 12/12/2023   Lab Results  Component Value Date   ALT 24 02/03/2016   AST 23 02/03/2016   ALKPHOS 77 02/03/2016   BILITOT 0.6 02/03/2016   No results found for: "INR", "PROTIME"  Current radiological studies    CT Soft Tissue Neck W Contrast Result Date: 12/10/2023 CLINICAL DATA:  Initial evaluation for peritonsillar abscess. EXAM: CT NECK WITH CONTRAST TECHNIQUE: Multidetector CT imaging of the neck was performed using the standard protocol following the bolus administration of intravenous contrast. RADIATION DOSE REDUCTION: This exam was performed according to the departmental dose-optimization program which includes automated exposure control, adjustment of the mA and/or kV according to patient size and/or use of iterative reconstruction technique. CONTRAST:  75mL OMNIPAQUE IOHEXOL 300 MG/ML  SOLN COMPARISON:  None Available. FINDINGS: Pharynx and larynx: Endotracheal and enteric tubes in place. The enteric tube courses superiorly into the posterior nasal passages/nasopharynx before coursing inferiorly into the esophagus. Oral cavity itself within normal limits. Enlargement of the left palatine tonsil, consistent with acute tonsillitis. Superimposed heterogeneous rim enhancing collection measuring approximately 3.3 x 2.6 x 4.2 cm in greatest  dimensions, consistent with tonsillar/peritonsillar abscess (series 2, image 42). Abscess extends superiorly towards the nasopharynx and soft palate. Associated swelling with hazy inflammatory stranding within the adjacent left parapharyngeal and submandibular spaces. No retropharyngeal collection. Epiglottis itself is effaced and not seen. Remainder of the hypopharynx and supraglottic larynx grossly within normal limits. Glottis grossly symmetric and normal. Subglottic airway  clear. Tip of the ET tube terminates above the carina. Salivary glands: Salivary glands including the parotid and submandibular glands are within normal limits. Thyroid: Postoperative changes noted about the thyroid. Residual 1.5 cm right thyroid nodule (series 2, image 89). This has been previously evaluated by thyroid ultrasound and biopsy, most recently on 11/09/2015. Please refer to these examinations for any potential follow-up recommendations. Lymph nodes: Mild asymmetric prominence of left upper cervical lymph nodes, largest of which seen at level 2 and measures 1 cm in short axis, presumably reactive. Vascular: Normal intravascular enhancement seen within the neck. Proximal left IJ is partially compressed due to the inflammatory process within the left neck. Limited intracranial: Unremarkable. Visualized orbits: Unremarkable. Mastoids and visualized paranasal sinuses: Scattered mucosal thickening present about the ethmoidal air cells and maxillary sinuses. Paranasal sinuses are otherwise clear. Mastoid air cells and middle ear cavities are well pneumatized and free of fluid. Skeleton: No discrete or worrisome osseous lesions. Upper chest: No other acute finding. Other: None. IMPRESSION: 1. Findings consistent with acute left-sided tonsillitis with superimposed 3.3 x 2.6 x 4.2 cm tonsillar/peritonsillar abscess. 2. Mild asymmetric prominence of left upper cervical lymph nodes, presumably reactive. 3. Endotracheal and enteric tubes in place. The enteric tube courses superiorly into the posterior right nasal cavity before coursing inferiorly into the esophagus. 4. 1.5 cm right thyroid nodule (series 2, image 89). This has been previously evaluated by thyroid ultrasound and biopsy, most recently on 11/09/2015. Please refer to these examinations for any potential follow-up recommendations regarding this finding. Electronically Signed   By: Rise Mu M.D.   On: 12/10/2023 22:13   DG Chest Portable 1  View Result Date: 12/10/2023 CLINICAL DATA:  Intubation EXAM: PORTABLE CHEST 1 VIEW COMPARISON:  10/03/2016 FINDINGS: Endotracheal tube is 3.5 cm above the carina. NG tube is in the stomach. Heart and mediastinal contours are within normal limits. No focal opacities or effusions. No acute bony abnormality. IMPRESSION: Support devices in expected position. No active cardiopulmonary disease. Electronically Signed   By: Charlett Nose M.D.   On: 12/10/2023 21:21    Disposition: Home      Allergies as of 12/12/2023   No Known Allergies      Medication List     STOP taking these medications    cyclobenzaprine 10 MG tablet Commonly known as: FLEXERIL   meloxicam 15 MG tablet Commonly known as: MOBIC       TAKE these medications    amoxicillin-clavulanate 875-125 MG tablet Commonly known as: AUGMENTIN Take 1 tablet by mouth 2 (two) times daily.   calcium-vitamin D 500-200 MG-UNIT tablet Commonly known as: OSCAL WITH D Take 2 tablets by mouth 3 (three) times daily.   glucosamine-chondroitin 500-400 MG tablet Take 1 tablet by mouth 3 (three) times daily.   levothyroxine 75 MCG tablet Commonly known as: SYNTHROID TAKE 1 TABLET BY MOUTH EVERY DAY   multivitamin with minerals Tabs tablet Take 2 tablets by mouth daily.   VITAMIN D PO Take 1 tablet by mouth daily.         Follow-up appointment    With ENT on 12/26/23  Discharge Condition:  stable   Signed: Erick Alley 12/12/2023, 10:44 AM

## 2023-12-12 NOTE — Progress Notes (Signed)
Patient ID: Tanya Armstrong, female   DOB: 10/13/1966, 57 y.o.   MRN: [id number]  She looks great and should be able to start soft diet and then if ok discharge to home.   He throat has only left sided tonsil swelling as expected. Good airway. Wound open. No trismus. Neck without significant swelling.   She can be discharge on Augmentin for 10 days. Follow up in clinic in 2 weeks. Call [phone] for follow up or if any recurrence of symptoms.

## 2023-12-12 NOTE — Discharge Instructions (Signed)
Dear Tanya Armstrong Tanya Armstrong,  Thank you for letting us participate in your care. You were hospitalized for  Peritonsillar abscess which was drained. You were treated with steroids and antibiotics.   POST-HOSPITAL & CARE INSTRUCTIONS Complete course of antibiotics Follow up with the ENT doctors  [address], [address] [place] [id number] Apt on 12/26/23 at 1:00 pm Go to your follow up appointments (listed below)   DOCTOR'S APPOINTMENT   Future Appointments  Date Time Provider Department Center  12/12/2023 11:00 AM [facility] ECHO 6 [facility]ECHOL[facility]  12/26/2023  1:15 PM [address] [facility]-ENT[facility] None    Follow-up Information     Read Drivers, MD. Go on 12/26/2023.   Specialty: Otolaryngology Why: [address], [address] [place] [address] Apt time is 1:00 Contact information: [phone]                 Take care and be well!  Family Medicine Teaching Service Inpatient Team [place]  [facility]  [address] [place], [place] [address] [phone]

## 2023-12-15 LAB — CULTURE, BLOOD (ROUTINE X 2)
Culture: NO GROWTH
Special Requests: ADEQUATE

## 2023-12-15 LAB — AEROBIC CULTURE W GRAM STAIN (SUPERFICIAL SPECIMEN)

## 2023-12-15 LAB — BLOOD CULTURE ID PANEL (REFLEXED) - BCID2

## 2023-12-16 LAB — CULTURE, BLOOD (ROUTINE X 2)
Culture  Setup Time: NO GROWTH
Special Requests: ADEQUATE

## 2023-12-26 ENCOUNTER — Ambulatory Visit (INDEPENDENT_AMBULATORY_CARE_PROVIDER_SITE_OTHER): Payer: BC Managed Care – PPO | Admitting: Otolaryngology

## 2023-12-26 ENCOUNTER — Encounter (INDEPENDENT_AMBULATORY_CARE_PROVIDER_SITE_OTHER): Payer: Self-pay

## 2023-12-26 VITALS — BP 119/82 | HR 98 | Resp 19 | Ht 62.0 in | Wt 98.0 lb

## 2023-12-26 DIAGNOSIS — J358 Other chronic diseases of tonsils and adenoids: Secondary | ICD-10-CM | POA: Diagnosis not present

## 2023-12-26 DIAGNOSIS — E890 Postprocedural hypothyroidism: Secondary | ICD-10-CM

## 2023-12-26 DIAGNOSIS — E041 Nontoxic single thyroid nodule: Secondary | ICD-10-CM

## 2023-12-26 DIAGNOSIS — J36 Peritonsillar abscess: Secondary | ICD-10-CM

## 2023-12-26 NOTE — Progress Notes (Signed)
Otolaryngology Clinic Note Referring provider: Emergency Department (Dr. Denese Killings) HPI:  Tanya Armstrong is a 57 y.o. female kindly referred by Dr. Denese Killings for evaluation of left PTA.  Patient reports: she is doing much better after the drainage. She has finished the antibiotics. No sore throat or pain in throat. No trismus. No fevers or chills. No nigh sweats or weight loss. She feels completely back to normal. Patient otherwise denies: - dysphagia, odynophagia, unintentional weight loss - changes in voice, shortness of breath, hemoptysis tobacco or significant alcohol history - ear pain, neck masses No nasal drainage, PND, facial pain/pressure, sinus infections  Of note, CT Neck also showed some residual thyroid tissue in the thyroid bed which was noted after total thyroidectomy. She denies compressive sx or pain or neck masses. Does not have any hypo or hyperthyroid sx.   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 Alcohol: no. 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 Lives in [place], [place]  Independent Review of Additional Tests or Records:  ED notes and admission (12/12/2023) Dr. Yetta Barre - 57 yo F with peri-tonsillar abscess. 2d of sore throat, pain with swallowing, can't eat. Intubated du eto voice changes, gurgling respirattion S/p drainage on 12/11/2023 of PTA with Dr. Jearld Fenton; 12/10/2023 - fever, PTA, Leukocytosis (19.3). At discharge on 12/13, no trismus. D/c on Augmentin  12/11/2023 Dr. Jearld Fenton (ENT) - sore throat, difficulty breathing. Dx with PTA. S/p I&D 12/12/2023. Noted large PTA, abscess had already drained and purulence.   CBC 12/12/2023: WBC 14.2 from 19.8 on 12/11/2023 BMP: no AKI; Glc 178 Cx 12/11/2023: Strep salivarius, strep constellatus; pans sensitive except erythromycin for salivarius HIV 12/11/2023  Non-reactive BCX 12/10/2023: NGTD except for 1/2 Peptostreptococcus   No recent TSH; Last thy [place] 2016. Dr. Elvera Lennox notes (2017): multicystic thyroid - neck pressure, with dysphagia; benign cyst, s/p total thyroid which has helped Dr. Derrell Lolling op note (2017) - notes small portion of right thyroid lobe left behind but in op note reports it was dissected  CT Neck (11/2023): independently reviewed - large PTA and pharyngeal abscess on left. Some level 1b/2 Lns, do not appear pathologic - more like reactive      PMH/Meds/All/SocHx/FamHx/ROS:   Past Medical History:  Diagnosis Date   Abdominal bloating    Cough    Dry throat    FUO (fever of unknown origin)    H/O one miscarriage    Headache    Multiple thyroid nodules    Thyroid cyst      Past Surgical History:  Procedure Laterality Date   DILATION AND CURETTAGE OF UTERUS     THYROIDECTOMY N/A 02/02/2016   Procedure: TOTAL THYROIDECTOMY;  Surgeon: Axel Filler, MD;  Location: [facility];  Service: General;  Laterality: N/A;   TONSILLECTOMY AND ADENOIDECTOMY N/A 12/11/2023   Procedure: Irrigation and Debridement of Peritonsillar Abscess;  Surgeon: Suzanna Obey, MD;  Location: [facility] OR;  Service: ENT;  Laterality: N/A;    History reviewed. No pertinent family history.   Social Connections: Not on file      Current Outpatient Medications:    docusate (COLACE) 50 MG/5ML liquid, Take 10 mLs (100 mg total) by mouth 2 (two) times daily., Disp: 100 mL, Rfl: 0   levothyroxine (SYNTHROID) 75 MCG tablet, Take 1 tablet (75 mcg total) by mouth daily at 6 (six) AM., Disp: 30 tablet, Rfl: 0  amoxicillin-clavulanate (AUGMENTIN) 875-125 MG tablet, Take 1 tablet by mouth 2 (two) times daily. (Patient not taking: Reported on 12/26/2023), Disp: 17 tablet, Rfl: 0   calcium-vitamin D (OSCAL WITH D) 500-200 MG-UNIT tablet, Take 2 tablets by mouth 3 (three) times daily. (Patient not taking: Reported on 12/26/2023), Disp: 60 tablet, Rfl: 0    Cholecalciferol (VITAMIN D PO), Take 1 tablet by mouth daily. (Patient not taking: Reported on 12/26/2023), Disp: , Rfl:    glucosamine-chondroitin 500-400 MG tablet, Take 1 tablet by mouth 3 (three) times daily. (Patient not taking: Reported on 12/26/2023), Disp: , Rfl:    Multiple Vitamin (MULTIVITAMIN WITH MINERALS) TABS tablet, Take 2 tablets by mouth daily.  (Patient not taking: Reported on 12/26/2023), Disp: , Rfl:    polyethylene glycol powder (GLYCOLAX/MIRALAX) 17 GM/SCOOP powder, Dissolve 1 capful (17 g) in water  and take by mouth daily. (Patient not taking: Reported on 12/26/2023), Disp: 238 g, Rfl: 0   Physical Exam:   BP 119/82 (BP Location: Left Arm, Patient Position: Sitting, Cuff Size: Normal)   Pulse 98   Resp 19   Ht 5\' 2"  (1.575 m)   Wt 98 lb (44.5 kg)   LMP  (LMP Unknown)   SpO2 97%   BMI 17.92 kg/m   Salient findings:  CN II-XII intact  Bilateral EAC clear and TM intact with well pneumatized middle ear spaces Anterior rhinoscopy: Septum relatively mildline; bilateral inferior turbinates without significant hypertrophy No lesions of oral cavity/oropharynx EXCEPT - well healed left tonsillar pillar, with mild asymmetry of tonsils; no palpable abnormality of tonsils or palpable tongue base; dentition fair. Given asymmetry, TFL was performed for further evaluation and documented below No obviously palpable neck masses/lymphadenopathy/thyromegaly No respiratory distress or stridor  Seprately Identifiable Procedures:  Procedure Note Pre-procedure diagnosis:  Tonsil asymmetry, rule out occult carcinoma s/p peritonsillar abscess drainage Post-procedure diagnosis: Same Procedure: Transnasal Fiberoptic Laryngoscopy, CPT 31575 - Mod 25 Indication: Tonsil asymmetry, rule out occult carcinoma s/p peritonsillar abscess drainage Complications: None apparent EBL: 0 mL  The procedure was undertaken to further evaluate the patient's complaint of Tonsil asymmetry, rule out occult  carcinoma s/p peritonsillar abscess drainage, with mirror exam inadequate for appropriate examination due to gag reflex and poor patient tolerance  Procedure:  Patient was identified as correct patient. Verbal consent was obtained. The nose was sprayed with oxymetazoline and 4% lidocaine. The The flexible laryngoscope was passed through the nose to view the nasal cavity, pharynx (oropharynx, hypopharynx) and larynx.  The larynx was examined at rest and during multiple phonatory tasks. Documentation was obtained and reviewed with patient. The scope was removed. The patient tolerated the procedure well.  Findings: The nasal cavity and nasopharynx did not reveal any masses or lesions, mucosa appeared to be without obvious lesions. The tongue base, pharyngeal walls, piriform sinuses, vallecula, epiglottis and postcricoid region are normal in appearance EXCEPT: mild prominence left tonsil compared to right. The visualized portion of the subglottis and proximal trachea is widely patent. The vocal folds are mobile bilaterally. There are no lesions on the free edge of the vocal folds nor elsewhere in the larynx worrisome for malignancy.      Electronically signed by: Read Drivers, MD 12/26/2023 2:29 PM   Impression & Plans:  Tanya Armstrong is a 57 y.o. female with:  1. Tonsil asymmetry   2. Peritonsillar abscess   3. Thyroid nodule   4. S/P total thyroidectomy    Resolution of PTA; some tonsil asymmetry which is likely just  healing edema; but, given how large the abscess was, and no specific story, would recommend getting repeat CT in 3 months to ensure resolution and no occult malignancy underlying  Thyroid nodule: follows FM for Synthroid; h/o multicystic thyroid goiter - Dr. Jacinto Halim notes says some right thyroid potentially left behind (but some conflicting reports in op note); pathology was benign, and asymptomatic but noted nodule on CT. Doubt anything would need to be done, but will refer to  endocrine in case further workup needed as she has been lost to follow up  - CT Neck 3 months - f/u in 3 months - Refer to endo   See below regarding exact medications prescribed this encounter including dosages and route: No orders of the defined types were placed in this encounter.     Thank you for allowing me the opportunity to care for your patient. Please do not hesitate to contact me should you have any other questions.  Sincerely, Jovita Kussmaul, MD Otolarynoglogist (ENT), [facility] Phone: [phone] Fax: [phone]  12/26/2023, 2:29 PM   I have personally spent 55 minutes involved in face-to-face and non-face-to-face activities for this patient on the day of the visit.  Professional time spent excludes any procedures performed but includes the following activities, in addition to those noted in the documentation: preparing to see the patient (review of outside documentation and results), performing a medically appropriate examination, counseling and educating, referring and communicating with other healthcare professionals, documenting clinical information in the electronic or other health record, independently interpreting results (CT) and communicating results with the patient.

## 2023-12-26 NOTE — Patient Instructions (Addendum)
I have ordered an imaging study for you to complete prior to your next visit. Please call [facility] Scheduling at [phone] to schedule your imaging if you have not received a call within 24 hours. If you are unable to complete your imaging study prior to your next scheduled visit please call our office to let us know.   I have also referred you to Endocrinology - Dr. Elvera Lennox --- their office will call you to schedule appointment.

## 2024-03-23 DIAGNOSIS — Z0000 Encounter for general adult medical examination without abnormal findings: Secondary | ICD-10-CM | POA: Diagnosis not present

## 2024-03-23 DIAGNOSIS — E890 Postprocedural hypothyroidism: Secondary | ICD-10-CM | POA: Diagnosis not present

## 2024-03-23 DIAGNOSIS — E782 Mixed hyperlipidemia: Secondary | ICD-10-CM | POA: Diagnosis not present

## 2024-04-27 ENCOUNTER — Ambulatory Visit (INDEPENDENT_AMBULATORY_CARE_PROVIDER_SITE_OTHER): Payer: BC Managed Care – PPO

## 2024-11-02 DIAGNOSIS — Z1331 Encounter for screening for depression: Secondary | ICD-10-CM | POA: Diagnosis not present

## 2024-11-02 DIAGNOSIS — Z1231 Encounter for screening mammogram for malignant neoplasm of breast: Secondary | ICD-10-CM | POA: Diagnosis not present

## 2024-11-02 DIAGNOSIS — Z01419 Encounter for gynecological examination (general) (routine) without abnormal findings: Secondary | ICD-10-CM | POA: Diagnosis not present
